# Patient Record
Sex: Female | Born: 1980 | Race: Black or African American | Hispanic: No | Smoking: Never smoker
Health system: Southern US, Community
[De-identification: ages and names within clinical notes are randomized; demographics above are authoritative.]

## PROBLEM LIST (undated history)

## (undated) ENCOUNTER — Inpatient Hospital Stay (HOSPITAL_COMMUNITY): Payer: Self-pay

## (undated) DIAGNOSIS — T7840XA Allergy, unspecified, initial encounter: Secondary | ICD-10-CM

## (undated) DIAGNOSIS — D649 Anemia, unspecified: Secondary | ICD-10-CM

## (undated) DIAGNOSIS — Z789 Other specified health status: Secondary | ICD-10-CM

## (undated) DIAGNOSIS — N390 Urinary tract infection, site not specified: Secondary | ICD-10-CM

## (undated) HISTORY — DX: Allergy, unspecified, initial encounter: T78.40XA

## (undated) HISTORY — DX: Urinary tract infection, site not specified: N39.0

## (undated) HISTORY — DX: Anemia, unspecified: D64.9

---

## 2011-09-19 NOTE — L&D Delivery Note (Signed)
Patient was C/C/+2.  I was bedside and evaluated the strip at about 0530 and she began pushing shortly after. She began having severe variables with each contraction and then had bradycardia to the 70s starting at about 0614.  At 0620 I was called and I was at bedside immediately.  I consented pt for VE and placed it and pulled baby out In one contraction.   Baby was slightly floppy but moving.  NICU in attendance and they were able to stimulate baby Without intervention of O2.  VE Female infant, Apgars 3,8, weight P.   Cord ph was 7.08. The patient had one second degree hymenal lacerations repaired with 2-0 vicryl R. Fundus was firm. EBL was expected. Placenta was delivered intact. Vagina was clear.  Baby was then vigorous to bedside.  Jhovany Weidinger A

## 2011-11-09 ENCOUNTER — Encounter (HOSPITAL_COMMUNITY): Payer: Self-pay | Admitting: Emergency Medicine

## 2011-11-09 ENCOUNTER — Emergency Department (HOSPITAL_COMMUNITY)
Admission: EM | Admit: 2011-11-09 | Discharge: 2011-11-09 | Disposition: A | Discharge status: Home Health | Payer: Medicaid Other | Attending: Emergency Medicine | Admitting: Emergency Medicine

## 2011-11-09 DIAGNOSIS — R109 Unspecified abdominal pain: Secondary | ICD-10-CM | POA: Insufficient documentation

## 2011-11-09 DIAGNOSIS — H0019 Chalazion unspecified eye, unspecified eyelid: Secondary | ICD-10-CM

## 2011-11-09 DIAGNOSIS — N939 Abnormal uterine and vaginal bleeding, unspecified: Secondary | ICD-10-CM

## 2011-11-09 DIAGNOSIS — N898 Other specified noninflammatory disorders of vagina: Secondary | ICD-10-CM | POA: Insufficient documentation

## 2011-11-09 LAB — PREGNANCY, URINE: Preg Test, Ur: POSITIVE — AB

## 2011-11-09 LAB — URINALYSIS, ROUTINE W REFLEX MICROSCOPIC
Bilirubin Urine: NEGATIVE
Glucose, UA: NEGATIVE mg/dL
Specific Gravity, Urine: 1.029 (ref 1.005–1.030)

## 2011-11-09 LAB — POCT PREGNANCY, URINE: Preg Test, Ur: NEGATIVE

## 2011-11-09 LAB — URINE MICROSCOPIC-ADD ON

## 2011-11-09 NOTE — ED Notes (Signed)
Patient is resting comfortably. 

## 2011-11-09 NOTE — ED Notes (Signed)
Vital signs stable. 

## 2011-11-09 NOTE — Discharge Instructions (Signed)
Chalazion A chalazion is a swelling or hard lump on the eyelid caused by a blocked oil gland. Chalazions may occur on the upper or the lower eyelid.  CAUSES  Oil gland in the eyelid becomes blocked. SYMPTOMS   Swelling or hard lump on the eyelid. This lump may make it hard to see out of the eye.   The swelling may spread to areas around the eye.  TREATMENT   Although some chalazions disappear by themselves in 1 or 2 months, some chalazions may need to be removed.   Medicines to treat an infection may be required.  HOME CARE INSTRUCTIONS   Wash your hands often and dry them with a clean towel. Do not touch the chalazion.   Apply heat to the eyelid several times a day for 10 minutes to help ease discomfort and bring any yellowish white fluid (pus) to the surface. One way to apply heat to a chalazion is to use the handle of a metal spoon.   Hold the handle under hot water until it is hot, and then wrap the handle in paper towels so that the heat can come through without burning your skin.   Hold the wrapped handle against the chalazion and reheat the spoon handle as needed.   Apply heat in this fashion for 10 minutes, 4 times per day.   Return to your caregiver to have the pus removed if it does not break (rupture) on its own.   Do not try to remove the pus yourself by squeezing the chalazion or sticking it with a pin or needle.   Only take over-the-counter or prescription medicines for pain, discomfort, or fever as directed by your caregiver.  SEEK IMMEDIATE MEDICAL CARE IF:   You have pain in your eye.   Your vision changes.   The chalazion does not go away.   The chalazion becomes painful, red, or swollen, grows larger, or does not start to disappear after 2 weeks.  MAKE SURE YOU:   Understand these instructions.   Will watch your condition.   Will get help right away if you are not doing well or get worse.  Document Released: 09/01/2000 Document Revised: 05/17/2011  Document Reviewed: 12/20/2009 [facility] Patient Information 2012 [place], [place].Dysfunctional Uterine Bleeding Normally, menstrual periods begin between ages 79 to 79 in young women. A normal menstrual cycle/period may begin every 23 days up to 35 days and lasts from 1 to 7 days. Around 12 to 14 days before your menstrual period starts, ovulation (ovary produces an egg) occurs. When counting the time between menstrual periods, count from the first day of bleeding of the previous period to the first day of bleeding of the next period. Dysfunctional (abnormal) uterine bleeding is bleeding that is different from a normal menstrual period. Your periods may come earlier or later than usual. They may be lighter, have blood clots or be heavier. You may have bleeding between periods, or you may skip one period or more. You may have bleeding after sexual intercourse, bleeding after menopause, or no menstrual period. CAUSES   Pregnancy (normal, miscarriage, tubal).   IUDs (intrauterine device, birth control).   Birth control pills.   Hormone treatment.   Menopause.   Infection of the cervix.   Blood clotting problems.   Infection of the inside lining of the uterus.   Endometriosis, inside lining of the uterus growing in the pelvis and other female organs.   Adhesions (scar tissue) inside the uterus.   Obesity or  severe weight loss.   Uterine polyps inside the uterus.   Cancer of the vagina, cervix, or uterus.   Ovarian cysts or polycystic ovary syndrome.   Medical problems (diabetes, thyroid disease).   Uterine fibroids (noncancerous tumor).   Problems with your female hormones.   Endometrial hyperplasia, very thick lining and enlarged cells inside of the uterus.   Medicines that interfere with ovulation.   Radiation to the pelvis or abdomen.   Chemotherapy.  DIAGNOSIS   Your doctor will discuss the history of your menstrual periods, medicines you are taking, changes in your  weight, stress in your life, and any medical problems you may have.   Your doctor will do a physical and pelvic examination.   Your doctor may want to perform certain tests to make a diagnosis, such as:   Pap test.   Blood tests.   Cultures for infection.   CT scan.   Ultrasound.   Hysteroscopy.   Laparoscopy.   MRI.   Hysterosalpingography.   D and C.   Endometrial biopsy.  TREATMENT  Treatment will depend on the cause of the dysfunctional uterine bleeding (DUB). Treatment may include:  Observing your menstrual periods for a couple of months.   Prescribing medicines for medical problems, including:   Antibiotics.   Hormones.   Birth control pills.   Removing an IUD (intrauterine device, birth control).   Surgery:   D and C (scrape and remove tissue from inside the uterus).   Laparoscopy (examine inside the abdomen with a lighted tube).   Uterine ablation (destroy lining of the uterus with electrical current, laser, heat, or freezing).   Hysteroscopy (examine cervix and uterus with a lighted tube).   Hysterectomy (remove the uterus).  HOME CARE INSTRUCTIONS   If medicines were prescribed, take exactly as directed. Do not change or switch medicines without consulting your caregiver.   Long term heavy bleeding may result in iron deficiency. Your caregiver may have prescribed iron pills. They help replace the iron that your body lost from heavy bleeding. Take exactly as directed.   Do not take aspirin or medicines that contain aspirin one week before or during your menstrual period. Aspirin may make the bleeding worse.   If you need to change your sanitary pad or tampon more than once every 2 hours, stay in bed with your feet elevated and a cold pack on your lower abdomen. Rest as much as possible, until the bleeding stops or slows down.   Eat well-balanced meals. Eat foods high in iron. Examples are:   Leafy green vegetables.   Whole-grain breads and  cereals.   Eggs.   Meat.   Liver.   Do not try to lose weight until the abnormal bleeding has stopped and your blood iron level is back to normal. Do not lift more than ten pounds or do strenuous activities when you are bleeding.   For a couple of months, make note on your calendar, marking the start and ending of your period, and the type of bleeding (light, medium, heavy, spotting, clots or missed periods). This is for your caregiver to better evaluate your problem.  SEEK MEDICAL CARE IF:   You develop nausea (feeling sick to your stomach) and vomiting, dizziness, or diarrhea while you are taking your medicine.   You are getting lightheaded or weak.   You have any problems that may be related to the medicine you are taking.   You develop pain with your DUB.   You  want to remove your IUD.   You want to stop or change your birth control pills or hormones.   You have any type of abnormal bleeding mentioned above.   You are over 42 years old and have not had a menstrual period yet.   You are 31 years old and you are still having menstrual periods.   You have any of the symptoms mentioned above.   You develop a rash.  SEEK IMMEDIATE MEDICAL CARE IF:   An oral temperature above 102 F (38.9 C) develops.   You develop chills.   You are changing your sanitary pad or tampon more than once an hour.   You develop abdominal pain.   You pass out or faint.  Document Released: 09/01/2000 Document Revised: 05/17/2011 Document Reviewed: 08/03/2009 [facility] Patient Information 2012 [place], [place].

## 2011-11-09 NOTE — ED Notes (Signed)
MD at bedside. 

## 2011-11-09 NOTE — ED Provider Notes (Signed)
History     CSN: [id number]  Arrival date & time 11/09/11  4098   First MD Initiated Contact with Patient 11/09/11 0945      Chief Complaint  Patient presents with  . Vaginal Bleeding  . Abdominal Cramping    (Consider location/radiation/quality/duration/timing/severity/associated sxs/prior treatment) The history is provided by the patient.  G2P1A1 patient reports several days of vaginal bleeding which was normal for typical menstrual cycle.  She reports worsening abdominal cramping however.  She reports her vaginal bleeding abdominal cramping of subsided but she possibly had a positive pregnancy test at home 0 Vicryl evaluation here in the ER.  She denies abdominal cramping at this time.  She's had no vaginal bleeding today.  She denies fevers and chills.  She denies nausea and vomiting.  Nothing worsens her symptoms.  Nothing improves her symptoms.  Her symptoms are none.  Patient also complains of a bump to her right upper inner eyelid will approximately 2 weeks.  She's had no change in her vision.  She denies lumps or changes at the edge of her eyelids.  History reviewed. No pertinent past medical history.  History reviewed. No pertinent past surgical history.  History reviewed. No pertinent family history.  History  Substance Use Topics  . Smoking status: Not on file  . Smokeless tobacco: Not on file  . Alcohol Use: Not on file    OB History    Grav Para Term Preterm Abortions TAB SAB Ect Mult Living                  Review of Systems  Genitourinary: Positive for vaginal bleeding.  All other systems reviewed and are negative.    Allergies  Review of patient's allergies indicates no known allergies.  Home Medications   Current Outpatient Rx  Name Route Sig Dispense Refill  . MULTIVITAMIN/IRON PO Oral Take 1 tablet by mouth daily.    . TRIAMCINOLONE ACETONIDE 0.5 % EX OINT Topical Apply 1 application topically daily as needed. Eczema outbreak      BP 120/74   Pulse 85  Temp(Src) 97.7 F (36.5 C) (Oral)  Resp 91  SpO2 98%  Physical Exam  Nursing note and vitals reviewed. Constitutional: She is oriented to person, place, and time. She appears well-developed and well-nourished. No distress.  HENT:  Head: Normocephalic and atraumatic.       Patient with small chalazion of right upper eyelid.  No evidence of hordeolum.  Eyes: EOM are normal.  Neck: Normal range of motion.  Cardiovascular: Normal rate, regular rhythm and normal heart sounds.   Pulmonary/Chest: Effort normal and breath sounds normal.  Abdominal: Soft. She exhibits no distension. There is no tenderness.  Musculoskeletal: Normal range of motion.  Neurological: She is alert and oriented to person, place, and time.  Skin: Skin is warm and dry.  Psychiatric: She has a normal mood and affect. Judgment normal.    ED Course  Procedures (including critical care time)   Labs Reviewed  POCT PREGNANCY, URINE  URINALYSIS, ROUTINE W REFLEX MICROSCOPIC  PREGNANCY, URINE   No results found.   1. Vaginal bleeding   2. Chalazion       MDM  The patient's serum pregnancy test is negative.  I suspect this was a normal menstrual cycle.  She no longer has pain or vaginal bleeding we'll discharge home.  The patient has evidence of a chalazion.  She will be  placed on warm compresses.  Lyanne Co, MD 11/09/11 1018

## 2011-11-18 ENCOUNTER — Encounter (HOSPITAL_COMMUNITY): Payer: Self-pay | Admitting: *Deleted

## 2011-11-18 ENCOUNTER — Inpatient Hospital Stay (HOSPITAL_COMMUNITY): Payer: Medicaid Other

## 2011-11-18 ENCOUNTER — Inpatient Hospital Stay (HOSPITAL_COMMUNITY)
Admission: AD | Admit: 2011-11-18 | Discharge: 2011-11-19 | Disposition: A | Discharge status: Home / Self Care | Payer: Medicaid Other | Source: Ambulatory Visit | Attending: Obstetrics and Gynecology | Admitting: Obstetrics and Gynecology

## 2011-11-18 DIAGNOSIS — O4690 Antepartum hemorrhage, unspecified, unspecified trimester: Secondary | ICD-10-CM

## 2011-11-18 DIAGNOSIS — O34599 Maternal care for other abnormalities of gravid uterus, unspecified trimester: Secondary | ICD-10-CM | POA: Insufficient documentation

## 2011-11-18 DIAGNOSIS — O209 Hemorrhage in early pregnancy, unspecified: Secondary | ICD-10-CM | POA: Insufficient documentation

## 2011-11-18 DIAGNOSIS — R1032 Left lower quadrant pain: Secondary | ICD-10-CM | POA: Insufficient documentation

## 2011-11-18 DIAGNOSIS — D649 Anemia, unspecified: Secondary | ICD-10-CM | POA: Insufficient documentation

## 2011-11-18 DIAGNOSIS — N83209 Unspecified ovarian cyst, unspecified side: Secondary | ICD-10-CM | POA: Insufficient documentation

## 2011-11-18 DIAGNOSIS — O99019 Anemia complicating pregnancy, unspecified trimester: Secondary | ICD-10-CM | POA: Insufficient documentation

## 2011-11-18 HISTORY — DX: Other specified health status: Z78.9

## 2011-11-18 LAB — CBC
Hemoglobin: 9.8 g/dL — ABNORMAL LOW (ref 12.0–15.0)
MCH: 21.9 pg — ABNORMAL LOW (ref 26.0–34.0)
MCHC: 31.8 g/dL (ref 30.0–36.0)
Platelets: 289 10*3/uL (ref 150–400)
RDW: 14.6 % (ref 11.5–15.5)

## 2011-11-18 LAB — WET PREP, GENITAL
Clue Cells Wet Prep HPF POC: NONE SEEN
Trich, Wet Prep: NONE SEEN
Yeast Wet Prep HPF POC: NONE SEEN

## 2011-11-18 LAB — URINALYSIS, ROUTINE W REFLEX MICROSCOPIC
Bilirubin Urine: NEGATIVE
Ketones, ur: 15 mg/dL — AB
Nitrite: NEGATIVE
Protein, ur: NEGATIVE mg/dL
Urobilinogen, UA: 0.2 mg/dL (ref 0.0–1.0)

## 2011-11-18 LAB — HCG, QUANTITATIVE, PREGNANCY: hCG, Beta Chain, Quant, S: 4623 m[IU]/mL — ABNORMAL HIGH (ref <5)

## 2011-11-18 MED ORDER — INTEGRA F 125-1 MG PO CAPS: 1.0000 | ORAL_CAPSULE | Freq: Every day | ORAL | Status: DC

## 2011-11-18 NOTE — Progress Notes (Signed)
Threasa Heads CNM in. Spec exam and bimanual.

## 2011-11-18 NOTE — Progress Notes (Signed)
Threasa Heads CNM in to see pt and discuss u/s results and d./c plan. To return in 7 days for repeat u/s. Understanding voiced

## 2011-11-18 NOTE — Discharge Instructions (Signed)
Vaginal Bleeding During Pregnancy °A small amount of bleeding from the vagina can happen anytime during pregnancy. Be sure to tell your doctor about all vaginal bleeding.  °HOME CARE °· Get plenty of rest and sleep.  °· Count the number of pads you use each day. Do not use tampons.  °· Save any tissue you pass for your doctor to see.  °· Do not exercise  °· Do not do any heavy lifting.  °· Avoid going up and down stairs. If you must climb stairs, go slowly.  °· Do not have sex (intercourse) or orgasms until approved by your doctor.  °· Do not douche.  °· Only take medicine as told by your doctor. Do not take aspirin.  °· Eat healthy.  °· Always keep your follow-up appointments.  °GET HELP RIGHT AWAY IF:  °· You feel the baby moving less or not moving at all.  °· The bleeding gets worse.  °· You have very painful cramps or pain in your stomach or back.  °· You pass large clots or anything that looks like tissue.  °· You have a temperature by mouth above 102° F (38.9° C).  °· You feel very weak.  °· You have chills.  °· You feel dizzy or pass out (faint).  °· You have a gush of fluid from the vagina.  °MAKE SURE YOU:  °· Understand these instructions.  °· Will watch your condition.  °· Will get help right away if you are not doing well or get worse.  °Document Released: 06/13/2008 Document Revised: 05/17/2011 Document Reviewed: 08/10/2009 °[employer]® Patient Information ©2012 [employer]. °

## 2011-11-18 NOTE — Progress Notes (Signed)
Now noted currently.

## 2011-11-18 NOTE — Progress Notes (Signed)
G3P1 TAB 1. Pain in LLQ couple hrs ago. Lambert Mody and comes and goes. Vag. bleeding about an hour ago. R shoulder pain after vag. bleeding. Shoulder pain gone now.

## 2011-11-18 NOTE — ED Provider Notes (Signed)
History     Chief Complaint  Patient presents with  . Vaginal Bleeding  . Abdominal Pain   HPI  Pain in LLQ that started a couple hrs ago. Pain is sharp and intermittent. Vag. bleeding started about an hour ago. Also reports right shoulder pain after vag. bleeding. Now longer has shoulder pain.  BHCG 3000 (11/17/11) at [facility].  Ultrasound two weeks ago - nothing seen in uterus.  No UTI symptoms.   Past Medical History  Diagnosis Date  . No pertinent past medical history     Past Surgical History  Procedure Date  . Cesarean section     Family History  Problem Relation Age of Onset  . Anesthesia problems Neg Hx   . Hypotension Neg Hx   . Malignant hyperthermia Neg Hx   . Pseudochol deficiency Neg Hx     History  Substance Use Topics  . Smoking status: Never Smoker   . Smokeless tobacco: Not on file  . Alcohol Use: No    Allergies: No Known Allergies  Prescriptions prior to admission  Medication Sig Dispense Refill  . triamcinolone ointment (KENALOG) 0.5 % Apply 1 application topically daily as needed. Eczema outbreak        Review of Systems  Gastrointestinal: Positive for abdominal pain.  Genitourinary:       Vaginal bleeding  All other systems reviewed and are negative.   Physical Exam   Blood pressure 115/63, pulse 86, temperature 97.6 F (36.4 C), temperature source Oral, resp. rate 20, height 5\' 3"  (1.6 m), weight 66.225 kg (146 lb), SpO2 100.00%.  Physical Exam  Constitutional: She is oriented to person, place, and time. She appears well-developed and well-nourished. No distress.  HENT:  Head: Normocephalic.  Eyes: Pupils are equal, round, and reactive to light.  Neck: Normal range of motion. Neck supple.  Cardiovascular: Normal rate, regular rhythm and normal heart sounds.   Respiratory: Effort normal and breath sounds normal.  GI: Soft. She exhibits no mass. There is no tenderness. There is no guarding.  Genitourinary: No bleeding around the  vagina. Vaginal discharge (white, creamy) found.       Negative cervical motion tenderness  Neurological: She is alert and oriented to person, place, and time. She has normal reflexes.  Skin: Skin is warm and dry.    MAU Course  Procedures Results for orders placed during the hospital encounter of 11/18/11 (from the past 24 hour(s))  URINALYSIS, ROUTINE W REFLEX MICROSCOPIC     Status: Abnormal   Collection Time   11/18/11  9:10 PM      Component Value Range   Color, Urine YELLOW  YELLOW    APPearance CLEAR  CLEAR    Specific Gravity, Urine 1.020  1.005 - 1.030    pH 7.5  5.0 - 8.0    Glucose, UA NEGATIVE  NEGATIVE (mg/dL)   Hgb urine dipstick MODERATE (*) NEGATIVE    Bilirubin Urine NEGATIVE  NEGATIVE    Ketones, ur 15 (*) NEGATIVE (mg/dL)   Protein, ur NEGATIVE  NEGATIVE (mg/dL)   Urobilinogen, UA 0.2  0.0 - 1.0 (mg/dL)   Nitrite NEGATIVE  NEGATIVE    Leukocytes, UA NEGATIVE  NEGATIVE   URINE MICROSCOPIC-ADD ON     Status: Abnormal   Collection Time   11/18/11  9:10 PM      Component Value Range   Squamous Epithelial / LPF MANY (*) RARE    WBC, UA 0-2  <3 (WBC/hpf)   RBC / HPF  3-6  <3 (RBC/hpf)  POCT PREGNANCY, URINE     Status: Abnormal   Collection Time   11/18/11  9:17 PM      Component Value Range   Preg Test, Ur POSITIVE (*) NEGATIVE   WET PREP, GENITAL     Status: Abnormal   Collection Time   11/18/11 10:05 PM      Component Value Range   Yeast Wet Prep HPF POC NONE SEEN  NONE SEEN    Trich, Wet Prep NONE SEEN  NONE SEEN    Clue Cells Wet Prep HPF POC NONE SEEN  NONE SEEN    WBC, Wet Prep HPF POC FEW (*) NONE SEEN   CBC     Status: Abnormal   Collection Time   11/18/11 10:10 PM      Component Value Range   WBC 5.8  4.0 - 10.5 (K/uL)   RBC 4.47  3.87 - 5.11 (MIL/uL)   Hemoglobin 9.8 (*) 12.0 - 15.0 (g/dL)   HCT 16.1 (*) 09.6 - 46.0 (%)   MCV 68.9 (*) 78.0 - 100.0 (fL)   MCH 21.9 (*) 26.0 - 34.0 (pg)   MCHC 31.8  30.0 - 36.0 (g/dL)   RDW 04.5  40.9 - 81.1 (%)    Platelets 289  150 - 400 (K/uL)  HCG, QUANTITATIVE, PREGNANCY     Status: Abnormal   Collection Time   11/18/11 10:10 PM      Component Value Range   hCG, Beta Chain, Quant, S 4623 (*) <5 (mIU/mL)  ABO/RH     Status: Normal   Collection Time   11/18/11 10:10 PM      Component Value Range   ABO/RH(D) O POS     Ultrasound: IMPRESSION:  1. Single intrauterine gestational sac without visible embryo.  These findings may be consistent with an early intrauterine  pregnancy. Recommend follow-up imaging in 7 to 10 days to ensure  normal progression of pregnancy and to exclude an anembryonic  (failed) pregnancy.  2. Simple appearing left ovarian cyst.   Assessment and Plan  Anemia Intrauterine Pregnancy  Plan:  DC to home Bleeding precautions RX Integra F/u ultrasound in 7-10 day.  [facility] 11/18/2011, 9:45 PM

## 2011-11-19 NOTE — ED Notes (Signed)
U/S will call pt tomorrow with date and time of appt. Pt's number given to u/s by pt.

## 2011-11-20 ENCOUNTER — Other Ambulatory Visit: Payer: Self-pay | Admitting: Physician Assistant

## 2011-11-20 DIAGNOSIS — O3680X0 Pregnancy with inconclusive fetal viability, not applicable or unspecified: Secondary | ICD-10-CM

## 2011-11-20 LAB — GC/CHLAMYDIA PROBE AMP, GENITAL: GC Probe Amp, Genital: NEGATIVE

## 2011-11-27 ENCOUNTER — Ambulatory Visit (HOSPITAL_COMMUNITY)
Admission: RE | Admit: 2011-11-27 | Discharge: 2011-11-27 | Disposition: A | Discharge status: Home / Self Care | Payer: Medicaid Other | Source: Ambulatory Visit | Attending: Physician Assistant | Admitting: Physician Assistant

## 2011-11-27 ENCOUNTER — Ambulatory Visit (HOSPITAL_COMMUNITY): Payer: Medicaid Other

## 2011-11-27 DIAGNOSIS — O34219 Maternal care for unspecified type scar from previous cesarean delivery: Secondary | ICD-10-CM | POA: Insufficient documentation

## 2011-11-27 DIAGNOSIS — O3680X0 Pregnancy with inconclusive fetal viability, not applicable or unspecified: Secondary | ICD-10-CM | POA: Insufficient documentation

## 2011-11-30 NOTE — ED Provider Notes (Signed)
Agree with above note.  Lisa Singh 11/30/2011 10:40 AM

## 2012-01-01 LAB — OB RESULTS CONSOLE HIV ANTIBODY (ROUTINE TESTING): HIV: NONREACTIVE

## 2012-01-01 LAB — OB RESULTS CONSOLE ANTIBODY SCREEN: Antibody Screen: NEGATIVE

## 2012-01-01 LAB — OB RESULTS CONSOLE HEPATITIS B SURFACE ANTIGEN: Hepatitis B Surface Ag: NEGATIVE

## 2012-01-01 LAB — OB RESULTS CONSOLE RUBELLA ANTIBODY, IGM: Rubella: IMMUNE

## 2012-01-01 LAB — OB RESULTS CONSOLE RPR: RPR: NONREACTIVE

## 2012-05-12 ENCOUNTER — Inpatient Hospital Stay (HOSPITAL_COMMUNITY)
Admission: AD | Admit: 2012-05-12 | Discharge: 2012-05-12 | Disposition: A | Discharge status: Home / Self Care | Payer: BC Managed Care – PPO | Source: Ambulatory Visit | Attending: Obstetrics and Gynecology | Admitting: Obstetrics and Gynecology

## 2012-05-12 ENCOUNTER — Encounter (HOSPITAL_COMMUNITY): Payer: Self-pay | Admitting: *Deleted

## 2012-05-12 DIAGNOSIS — R079 Chest pain, unspecified: Secondary | ICD-10-CM | POA: Insufficient documentation

## 2012-05-12 DIAGNOSIS — O99891 Other specified diseases and conditions complicating pregnancy: Secondary | ICD-10-CM | POA: Insufficient documentation

## 2012-05-12 MED ORDER — GI COCKTAIL ~~LOC~~
30.0000 mL | Freq: Three times a day (TID) | ORAL | Status: DC | PRN
  Administered 2012-05-12: 30 mL via ORAL
  Filled 2012-05-12: qty 30

## 2012-05-12 NOTE — MAU Provider Note (Signed)
Lisa Singh is a 31 y.o. female presenting for eval of onset dull chest pain this evening. This pain has mostly resolved by the time of arrival to MAU. She also reports a feeling of 'poor circulation' in her right arm/fingers. Denies leak/bldg but reports irreg ctx earlier at home. History OB History    Grav Para Term Preterm Abortions TAB SAB Ect Mult Living   3 1 1  0 1 1 0 0 0 1     Past Medical History  Diagnosis Date  . No pertinent past medical history    Past Surgical History  Procedure Date  . Cesarean section    Family History: family history is negative for Anesthesia problems, and Hypotension, and Malignant hyperthermia, and Pseudochol deficiency, and Other, . Social History:  reports that she has never smoked. She does not have any smokeless tobacco history on file. She reports that she does not drink alcohol or use illicit drugs.    ROS    Blood pressure 123/69, pulse 88, temperature 97.6 F (36.4 C), temperature source Oral, resp. rate 20, height 5' 1.25" (1.556 m), weight 77.656 kg (171 lb 3.2 oz), last menstrual period 10/13/2011. Maternal Exam:  Uterine Assessment: One 40sec ctx in 40 mins of EFM so far  Cervix: Cx C/L/post  Fetal Exam Fetal Monitor Review: Baseline rate: 135.  Variability: moderate (6-25 bpm).   Pattern: accelerations present and no decelerations.    Fetal State Assessment: Category I - tracings are normal.     Physical Exam  Constitutional: She is oriented to person, place, and time. She appears well-developed and well-nourished.  HENT:  Head: Normocephalic.  Neck: Normal range of motion.  Cardiovascular: Normal rate.   Respiratory: Effort normal.  Genitourinary: Vagina normal.  Musculoskeletal: Normal range of motion.       Right arm without edema and with full ROM and strength  Neurological: She is alert and oriented to person, place, and time.  Skin: Skin is warm and dry.  Psychiatric: She has a normal mood and affect.  Her behavior is normal. Thought content normal.    Prenatal labs: ABO, Rh: --/--/O POS (03/02 2210) Antibody:   Rubella:   RPR:    HBsAg:    HIV:    GBS:     Assessment/Plan: IUP at 30.2 wks Chest pain- resolved  Given GI cocktail upon arrival which relieved symptoms; denies chest pain/right arm pain currently Rev'd pt with Dr Claiborne Billings with orders to d/c home Keep next scheduled visit   Brenn Deziel 05/12/2012, 1:24 AM

## 2012-05-12 NOTE — MAU Note (Signed)
My chest started hurting few hrs ago between my breast. Is dull pain that comes and goes.

## 2012-05-12 NOTE — Progress Notes (Signed)
Philipp Deputy CNM in earlier and d/c plan discussed. Written and verbal d/c instructions given and understanding voiced.

## 2012-06-23 ENCOUNTER — Inpatient Hospital Stay (HOSPITAL_COMMUNITY)
Admission: AD | Admit: 2012-06-23 | Discharge: 2012-06-26 | DRG: 372 | Disposition: A | Discharge status: Home / Self Care | Payer: BC Managed Care – PPO | Source: Ambulatory Visit | Attending: Obstetrics and Gynecology | Admitting: Obstetrics and Gynecology

## 2012-06-23 ENCOUNTER — Encounter (HOSPITAL_COMMUNITY): Payer: Self-pay | Admitting: *Deleted

## 2012-06-23 ENCOUNTER — Encounter (HOSPITAL_COMMUNITY): Payer: Self-pay | Admitting: Anesthesiology

## 2012-06-23 ENCOUNTER — Inpatient Hospital Stay (HOSPITAL_COMMUNITY): Payer: BC Managed Care – PPO | Admitting: Anesthesiology

## 2012-06-23 DIAGNOSIS — IMO0002 Reserved for concepts with insufficient information to code with codable children: Secondary | ICD-10-CM

## 2012-06-23 DIAGNOSIS — O1414 Severe pre-eclampsia complicating childbirth: Principal | ICD-10-CM | POA: Diagnosis present

## 2012-06-23 DIAGNOSIS — O1490 Unspecified pre-eclampsia, unspecified trimester: Secondary | ICD-10-CM

## 2012-06-23 DIAGNOSIS — O34219 Maternal care for unspecified type scar from previous cesarean delivery: Secondary | ICD-10-CM | POA: Diagnosis present

## 2012-06-23 LAB — CBC
HCT: 32.5 % — ABNORMAL LOW (ref 36.0–46.0)
HCT: 32.6 % — ABNORMAL LOW (ref 36.0–46.0)
Hemoglobin: 10.5 g/dL — ABNORMAL LOW (ref 12.0–15.0)
Hemoglobin: 10.5 g/dL — ABNORMAL LOW (ref 12.0–15.0)
MCV: 70.7 fL — ABNORMAL LOW (ref 78.0–100.0)
MCV: 70.1 fL — ABNORMAL LOW (ref 78.0–100.0)
Platelets: 173 10*3/uL (ref 150–400)
RBC: 4.6 MIL/uL (ref 3.87–5.11)
RBC: 4.65 MIL/uL (ref 3.87–5.11)
WBC: 8.1 10*3/uL (ref 4.0–10.5)
WBC: 7.9 10*3/uL (ref 4.0–10.5)

## 2012-06-23 LAB — URINALYSIS, ROUTINE W REFLEX MICROSCOPIC
Bilirubin Urine: NEGATIVE
Ketones, ur: NEGATIVE mg/dL
Nitrite: NEGATIVE
pH: 6 (ref 5.0–8.0)

## 2012-06-23 LAB — COMPREHENSIVE METABOLIC PANEL
BUN: 14 mg/dL (ref 6–23)
CO2: 20 mEq/L (ref 19–32)
Chloride: 104 mEq/L (ref 96–112)
Creatinine, Ser: 0.94 mg/dL (ref 0.50–1.10)
GFR calc non Af Amer: 81 mL/min — ABNORMAL LOW (ref >90)
Total Bilirubin: 0.1 mg/dL — ABNORMAL LOW (ref 0.3–1.2)

## 2012-06-23 LAB — URINE MICROSCOPIC-ADD ON

## 2012-06-23 LAB — LACTATE DEHYDROGENASE: LDH: 159 U/L (ref 94–250)

## 2012-06-23 LAB — URIC ACID: Uric Acid, Serum: 7.8 mg/dL — ABNORMAL HIGH (ref 2.4–7.0)

## 2012-06-23 MED ORDER — PHENYLEPHRINE 40 MCG/ML (10ML) SYRINGE FOR IV PUSH (FOR BLOOD PRESSURE SUPPORT): 80.0000 ug | PREFILLED_SYRINGE | INTRAVENOUS | Status: DC | PRN

## 2012-06-23 MED ORDER — LACTATED RINGERS IV SOLN
500.0000 mL | INTRAVENOUS | Status: DC | PRN
  Administered 2012-06-23: 500 mL via INTRAVENOUS

## 2012-06-23 MED ORDER — LABETALOL HCL 5 MG/ML IV SOLN
5.0000 mg | Freq: Once | INTRAVENOUS | Status: AC
  Administered 2012-06-23: 5 mg via INTRAVENOUS

## 2012-06-23 MED ORDER — LIDOCAINE HCL (PF) 1 % IJ SOLN
INTRAMUSCULAR | Status: DC | PRN
  Administered 2012-06-23 (×4): 4 mL

## 2012-06-23 MED ORDER — EPHEDRINE 5 MG/ML INJ: 10.0000 mg | INTRAVENOUS | Status: DC | PRN

## 2012-06-23 MED ORDER — FENTANYL 2.5 MCG/ML BUPIVACAINE 1/10 % EPIDURAL INFUSION (WH - ANES)
14.0000 mL/h | INTRAMUSCULAR | Status: DC
  Administered 2012-06-23 – 2012-06-24 (×2): 14 mL/h via EPIDURAL
  Filled 2012-06-23 (×2): qty 123

## 2012-06-23 MED ORDER — OXYTOCIN BOLUS FROM INFUSION
500.0000 mL | Freq: Once | INTRAVENOUS | Status: AC
  Administered 2012-06-24: 500 mL via INTRAVENOUS
  Filled 2012-06-23: qty 500

## 2012-06-23 MED ORDER — LABETALOL HCL 5 MG/ML IV SOLN
20.0000 mg | Freq: Once | INTRAVENOUS | Status: AC
  Administered 2012-06-23: 20 mg via INTRAVENOUS

## 2012-06-23 MED ORDER — PHENYLEPHRINE 40 MCG/ML (10ML) SYRINGE FOR IV PUSH (FOR BLOOD PRESSURE SUPPORT)
80.0000 ug | PREFILLED_SYRINGE | INTRAVENOUS | Status: DC | PRN
  Filled 2012-06-23: qty 5

## 2012-06-23 MED ORDER — MAGNESIUM SULFATE 40 G IN LACTATED RINGERS - SIMPLE
2.0000 g/h | INTRAVENOUS | Status: DC
  Administered 2012-06-23 – 2012-06-24 (×2): 2 g/h via INTRAVENOUS
  Filled 2012-06-23 (×2): qty 500

## 2012-06-23 MED ORDER — LIDOCAINE HCL (PF) 1 % IJ SOLN
30.0000 mL | INTRAMUSCULAR | Status: DC | PRN
  Filled 2012-06-23: qty 30

## 2012-06-23 MED ORDER — LABETALOL HCL 5 MG/ML IV SOLN
INTRAVENOUS | Status: AC
  Filled 2012-06-23: qty 4

## 2012-06-23 MED ORDER — OXYCODONE-ACETAMINOPHEN 5-325 MG PO TABS: 1.0000 | ORAL_TABLET | ORAL | Status: DC | PRN

## 2012-06-23 MED ORDER — LACTATED RINGERS IV SOLN: 500.0000 mL | Freq: Once | INTRAVENOUS | Status: DC

## 2012-06-23 MED ORDER — TERBUTALINE SULFATE 1 MG/ML IJ SOLN: 0.2500 mg | Freq: Once | INTRAMUSCULAR | Status: AC | PRN

## 2012-06-23 MED ORDER — EPHEDRINE 5 MG/ML INJ
10.0000 mg | INTRAVENOUS | Status: DC | PRN
  Filled 2012-06-23: qty 4

## 2012-06-23 MED ORDER — MAGNESIUM SULFATE BOLUS VIA INFUSION
4.0000 g | Freq: Once | INTRAVENOUS | Status: AC
  Administered 2012-06-23: 4 g via INTRAVENOUS
  Filled 2012-06-23: qty 500

## 2012-06-23 MED ORDER — FLEET ENEMA 7-19 GM/118ML RE ENEM: 1.0000 | ENEMA | RECTAL | Status: DC | PRN

## 2012-06-23 MED ORDER — IBUPROFEN 600 MG PO TABS: 600.0000 mg | ORAL_TABLET | Freq: Four times a day (QID) | ORAL | Status: DC | PRN

## 2012-06-23 MED ORDER — OXYTOCIN 40 UNITS IN LACTATED RINGERS INFUSION - SIMPLE MED: 1.0000 m[IU]/min | INTRAVENOUS | Status: DC

## 2012-06-23 MED ORDER — OXYTOCIN 40 UNITS IN LACTATED RINGERS INFUSION - SIMPLE MED
1.0000 m[IU]/min | INTRAVENOUS | Status: DC
  Administered 2012-06-23: 2 m[IU]/min via INTRAVENOUS
  Filled 2012-06-23: qty 1000

## 2012-06-23 MED ORDER — CITRIC ACID-SODIUM CITRATE 334-500 MG/5ML PO SOLN: 30.0000 mL | ORAL | Status: DC | PRN

## 2012-06-23 MED ORDER — DIPHENHYDRAMINE HCL 50 MG/ML IJ SOLN: 12.5000 mg | INTRAMUSCULAR | Status: DC | PRN

## 2012-06-23 MED ORDER — LACTATED RINGERS IV SOLN
INTRAVENOUS | Status: DC
  Administered 2012-06-23: 04:00:00 via INTRAVENOUS
  Administered 2012-06-23: 100 mL via INTRAVENOUS
  Administered 2012-06-24: 02:00:00 via INTRAVENOUS

## 2012-06-23 MED ORDER — ACETAMINOPHEN 325 MG PO TABS
650.0000 mg | ORAL_TABLET | ORAL | Status: DC | PRN
  Administered 2012-06-23 (×3): 650 mg via ORAL
  Filled 2012-06-23 (×3): qty 2

## 2012-06-23 MED ORDER — OXYTOCIN 40 UNITS IN LACTATED RINGERS INFUSION - SIMPLE MED
62.5000 mL/h | Freq: Once | INTRAVENOUS | Status: AC
  Administered 2012-06-24: 62.5 mL/h via INTRAVENOUS

## 2012-06-23 MED ORDER — ONDANSETRON HCL 4 MG/2ML IJ SOLN: 4.0000 mg | Freq: Four times a day (QID) | INTRAMUSCULAR | Status: DC | PRN

## 2012-06-23 NOTE — Plan of Care (Signed)
Problem: Consults Goal: Birthing Suites Patient Information Press F2 to bring up selections list Outcome: Completed/Met Date Met:  06/23/12  Pt < [redacted] weeks EGA, Inpatient induction and PIH (Pregnancy induced hypertension)  Comments:  36.2 wks

## 2012-06-23 NOTE — H&P (Signed)
31 y.o. [redacted]w[redacted]d  G3P1011 comes in c/o labor and found to have elevated BPs and 4+ proteinuria.  Otherwise has good fetal movement and no bleeding.  She does not c/o any other s/s severe preeclampsia.  Past Medical History  Diagnosis Date  . No pertinent past medical history     Past Surgical History  Procedure Date  . Cesarean section     OB History    Grav Para Term Preterm Abortions TAB SAB Ect Mult Living   3 1 1  0 1 1 0 0 0 1     # Outc Date GA Lbr Len/2nd Wgt Sex Del Anes PTL Lv   1 TRM 8/05    F LTCS      2 TAB            3 CUR               History   Social History  . Marital Status: Married    Spouse Name: N/A    Number of Children: N/A  . Years of Education: N/A   Occupational History  . Not on file.   Social History Main Topics  . Smoking status: Never Smoker   . Smokeless tobacco: Not on file  . Alcohol Use: No  . Drug Use: No  . Sexually Active: Yes   Other Topics Concern  . Not on file   Social History Narrative  . No narrative on file   Review of patient's allergies indicates no known allergies.   Prenatal Course:  Uncomplicated except for hx HSV.  Filed Vitals:   06/23/12 0516  BP: 152/95  Pulse: 76  Temp:   Resp:      BPS have been as high as 183/105 and responded to labetalol  Lungs/Cor:  NAD Abdomen:  soft, gravid, baby vtx by bedside u/s. Ex:  no cords, erythema SVE:  Closed/50/high SSE: no lesions inside or outside. FHTs:  120, good STV, NST R Toco:  q3-6   A/P   Severe preeclampsia at [redacted]w[redacted]d.  Start induction and Magnesium sulfate.  Labetalol IV for BP control.          Pt had C/S first pregnancy for AOAP but strongly desires TOL- will start pitocin.  GBS unknown for now- PCN.  Lisa Singh A

## 2012-06-23 NOTE — H&P (Signed)
Lisa Singh is a 31 y.o. female presenting for contractions. Maternal Medical History:  Reason for admission: Reason for admission: contractions.  Reason for Admission:   nauseaContractions: Onset was 3-5 hours ago.   Frequency: regular.   Perceived severity is moderate.    Fetal activity: Perceived fetal activity is normal.   Last perceived fetal movement was within the past hour.    Prenatal complications: Hypertension and pre-eclampsia.   Prenatal Complications - Diabetes: none.   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 No pertinent past medical history    Past Surgical History  Procedure Date  . Cesarean section    Family History: family history is negative for Anesthesia problems, and Hypotension, and Malignant hyperthermia, and Pseudochol deficiency, and Other, . Social History:  reports that she has never smoked. She does not have any smokeless tobacco history on file. She reports that she does not drink alcohol or use illicit drugs.   Prenatal Transfer Tool  Maternal Diabetes: No Genetic Screening: Normal Maternal Ultrasounds/Referrals: Normal Fetal Ultrasounds or other Referrals:  None Maternal Substance Abuse:  No Significant Maternal Medications:  None Significant Maternal Lab Results:  None  GBS not available, so will get rapid test  Other Comments:  None  Review of Systems  Constitutional: Negative for fever.  Eyes: Negative for blurred vision.  Cardiovascular: Positive for chest pain.  Gastrointestinal: Positive for abdominal pain. Negative for nausea, vomiting, diarrhea and constipation.  Neurological: Positive for headaches.    Dilation: Closed Effacement (%): 70 Station: -3 Exam by:: Artelia Laroche CNM Blood pressure 172/98, pulse 63, temperature 98 F (36.7 C), temperature source Oral, resp.  rate 20, height 5\' 2"  (1.575 m), weight 194 lb 3.2 oz (88.089 kg), last menstrual period 10/13/2011, SpO2 100.00%. Maternal Exam:  Uterine Assessment: Contraction strength is moderate.  Contraction frequency is regular.   Abdomen: Patient reports no abdominal tenderness. Estimated fetal weight is 6.   Fetal presentation: vertex  Introitus: Normal vulva. Vagina is positive for vaginal discharge (thick white).  Ferning test: not done.  Nitrazine test: not done. Amniotic fluid character: not assessed.  Pelvis: adequate for delivery.   Cervix: Cervix evaluated by digital exam.     Fetal Exam Fetal Monitor Review: Mode: ultrasound.   Baseline rate: 140.  Variability: moderate (6-25 bpm).   Pattern: accelerations present and no decelerations.    Fetal State Assessment: Category I - tracings are normal.     Physical Exam  Constitutional: She is oriented to person, place, and time. She appears well-developed and well-nourished. No distress (but uncomfortable with contractions).  Cardiovascular: Normal rate and regular rhythm.   Murmur (systolic) heard. Respiratory: Effort normal and breath sounds normal. No respiratory distress. She has no wheezes. She has no rales.  GI: Soft.  Genitourinary: Uterus normal. Vaginal discharge (thick white) found.       Dilation: Closed Effacement (%): 70 Station: -3 Exam by:: Artelia Laroche CNM   Musculoskeletal: Normal range of motion. She exhibits edema.  Neurological: She is alert and oriented to person, place, and time. She displays abnormal reflex (brisk).  Skin: Skin is warm and dry.  Psychiatric: She has a normal mood and affect.    Exam done for HSV history >>  No lesions or erethema noted  Prenatal labs: ABO, Rh: --/--/O POS (03/02 2210) Antibody:   Rubella:   RPR:    HBsAg:    HIV:    GBS:   Done 2 days ago, result not available.. Rapid test done  Results for orders placed during the hospital encounter of 06/23/12 (from the past  24 hour(s))  CBC     Status: Abnormal   Collection Time   06/23/12  2:30 AM      Component Value Range   WBC 8.1  4.0 - 10.5 K/uL   RBC 4.60  3.87 - 5.11 MIL/uL   Hemoglobin 10.5 (*) 12.0 - 15.0 g/dL   HCT 02.7 (*) 25.3 - 66.4 %   MCV 70.7 (*) 78.0 - 100.0 fL   MCH 22.8 (*) 26.0 - 34.0 pg   MCHC 32.3  30.0 - 36.0 g/dL   RDW 40.3  47.4 - 25.9 %   Platelets 166  150 - 400 K/uL  COMPREHENSIVE METABOLIC PANEL     Status: Abnormal   Collection Time   06/23/12  2:30 AM      Component Value Range   Sodium 134 (*) 135 - 145 mEq/L   Potassium 3.8  3.5 - 5.1 mEq/L   Chloride 104  96 - 112 mEq/L   CO2 20  19 - 32 mEq/L   Glucose, Bld 82  70 - 99 mg/dL   BUN 14  6 - 23 mg/dL   Creatinine, Ser 5.63  0.50 - 1.10 mg/dL   Calcium 8.4  8.4 - 87.5 mg/dL   Total Protein 5.9 (*) 6.0 - 8.3 g/dL   Albumin 2.1 (*) 3.5 - 5.2 g/dL   AST 21  0 - 37 U/L   ALT 21  0 - 35 U/L   Alkaline Phosphatase 143 (*) 39 - 117 U/L   Total Bilirubin 0.1 (*) 0.3 - 1.2 mg/dL   GFR calc non Af Amer 81 (*) >90 mL/min   GFR calc Af Amer >90  >90 mL/min  URIC ACID     Status: Abnormal   Collection Time   06/23/12  2:30 AM      Component Value Range   Uric Acid, Serum 7.8 (*) 2.4 - 7.0 mg/dL  LACTATE DEHYDROGENASE     Status: Normal   Collection Time   06/23/12  2:30 AM      Component Value Range   LDH 159  94 - 250 U/L  URINALYSIS, ROUTINE W REFLEX MICROSCOPIC     Status: Abnormal   Collection Time   06/23/12  2:58 AM      Component Value Range   Color, Urine YELLOW  YELLOW   APPearance CLEAR  CLEAR   Specific Gravity, Urine 1.020  1.005 - 1.030   pH 6.0  5.0 - 8.0   Glucose, UA NEGATIVE  NEGATIVE mg/dL   Hgb urine dipstick MODERATE (*) NEGATIVE   Bilirubin Urine NEGATIVE  NEGATIVE   Ketones, ur NEGATIVE  NEGATIVE mg/dL   Protein, ur >643 (*) NEGATIVE mg/dL   Urobilinogen, UA 0.2  0.0 - 1.0 mg/dL   Nitrite NEGATIVE  NEGATIVE   Leukocytes, UA NEGATIVE  NEGATIVE  URINE MICROSCOPIC-ADD ON     Status: Abnormal    Collection Time   06/23/12  2:58 AM      Component Value Range   Squamous Epithelial / LPF FEW (*) RARE   WBC, UA 0-2  <3 WBC/hpf   RBC / HPF 3-6  <3 RBC/hpf   Bacteria, UA FEW (*)  RARE    Assessment/Plan: A:  SIUP at [redacted]w[redacted]d       Preeclampsia      Latent Phase labor  P:  Discussed with Dr Henderson Cloud      Admit for labor augmentation      Magnesium sulfate     Finis Hendricksen 06/23/2012, 4:02 AM

## 2012-06-23 NOTE — Progress Notes (Signed)
Pt comfortable on Pit.  Filed Vitals:   06/23/12 1858 06/23/12 1901 06/23/12 1931 06/23/12 2001  BP: 140/89 138/87 157/99 145/95  Pulse: 82 82 80 85  Temp:      TempSrc:      Resp: 18 18 18 18   Height:      Weight:      SpO2:       FHTs 120s gstv, NST R, no decels. Toco q3-4 SVE 2/50/-2, AROM clear.  I asked pt before AROM if she desired to continue with induction.  She strongly desires continued TOL.  BPs are very stable off meds, FHTs are reassuring and her cervix ripened enough for AROM.  I agree with pt that it is currently ok to continue induction.

## 2012-06-23 NOTE — MAU Provider Note (Signed)
  History     CSN: [id number]  Arrival date and time: 06/23/12 1191   First Provider Initiated Contact with Patient 06/23/12 0229      Chief Complaint  Patient presents with  . Contractions  . Chest Pain   HPI This is a 31 y.o. female at [redacted]w[redacted]d who presents with c/o painful contractions all day today, worse over past 2 hours.  Developed some epigastric/chest pain on the way here. Does report increased swelling, mild headache (occipital).  Denies visual changes or RUQ pain.   OB History    Grav Para Term Preterm Abortions TAB SAB Ect Mult Living   3 1 1  0 1 1 0 0 0 1      Past Medical History  Diagnosis Date  . No pertinent past medical history     Past Surgical History  Procedure Date  . Cesarean section     Family History  Problem Relation Age of Onset  . Anesthesia problems Neg Hx   . Hypotension Neg Hx   . Malignant hyperthermia Neg Hx   . Pseudochol deficiency Neg Hx   . Other Neg Hx     History  Substance Use Topics  . Smoking status: Never Smoker   . Smokeless tobacco: Not on file  . Alcohol Use: No    Allergies: No Known Allergies  Prescriptions prior to admission  Medication Sig Dispense Refill  . Docosahexaenoic Acid (DHA OMEGA 3 PO) Take 1 capsule by mouth daily.      . Prenatal Vit-Fe Fumarate-FA (PRENATAL MULTIVITAMIN) TABS Take 1 tablet by mouth daily.      Marland Kitchen triamcinolone ointment (KENALOG) 0.5 % Apply 1 application topically daily as needed. Eczema outbreak      . Fe Fum-FePoly-FA-Vit C-Vit B3 (INTEGRA F) 125-1 MG CAPS Take 1 tablet by mouth daily.  30 capsule  1    ROS See HPI  Physical Exam   Blood pressure 167/107, pulse 62, temperature 98 F (36.7 C), temperature source Oral, resp. rate 20, height 5\' 2"  (1.575 m), weight 194 lb 3.2 oz (88.089 kg), last menstrual period 10/13/2011, SpO2 100.00%.  Physical Exam  Constitutional: She is oriented to person, place, and time. She appears well-developed and well-nourished. No distress (but  uncomfortable).  Cardiovascular: Normal rate and regular rhythm.   Murmur (systolic) heard. Respiratory: Effort normal and breath sounds normal. No respiratory distress. She has no wheezes. She has no rales.  GI: Soft. She exhibits no distension and no mass. There is no tenderness. There is no rebound and no guarding.  Genitourinary: Vagina normal and uterus normal. No vaginal discharge found.  Musculoskeletal: Normal range of motion. She exhibits edema (1+2+ lower extremity).  Neurological: She is alert and oriented to person, place, and time. She displays abnormal reflex (brisk, 3+).  Skin: Skin is warm and dry.  Psychiatric: She has a normal mood and affect.   EKG:  Sinus brady (58), possible LA enlargement, low voltage QRS  MAU Course  Procedures  MDM PIH labs drawn, UA.  Assessment and Plan  Admit per Dr Henderson Cloud See H&P  Wynelle Bourgeois 06/23/2012, 2:42 AM

## 2012-06-23 NOTE — Anesthesia Procedure Notes (Signed)
Epidural Patient location during procedure: OB Start time: 06/23/2012 10:21 PM  Staffing Performed by: anesthesiologist   Preanesthetic Checklist Completed: patient identified, site marked, surgical consent, pre-op evaluation, timeout performed, IV checked, risks and benefits discussed and monitors and equipment checked  Epidural Patient position: sitting Prep: site prepped and draped and DuraPrep Patient monitoring: continuous pulse ox and blood pressure Approach: midline Injection technique: LOR air  Needle:  Needle type: Tuohy  Needle gauge: 17 G Needle length: 9 cm and 9 Needle insertion depth: 5.5 cm Catheter type: closed end flexible Catheter size: 19 Gauge Catheter at skin depth: 10.5 cm Test dose: negative  Assessment Events: blood not aspirated, injection not painful, no injection resistance, negative IV test and no paresthesia  Additional Notes Discussed risk of headache, infection, bleeding, nerve injury and failed or incomplete block.  Patient voices understanding and wishes to proceed. Reason for block:procedure for pain

## 2012-06-23 NOTE — Anesthesia Preprocedure Evaluation (Signed)
Anesthesia Evaluation  Patient identified by MRN, date of birth, ID band Patient awake    Reviewed: Allergy & Precautions, H&P , NPO status , Patient's Chart, lab work & pertinent test results, reviewed documented beta blocker date and time   History of Anesthesia Complications Negative for: history of anesthetic complications  Airway Mallampati: III TM Distance: >3 FB Neck ROM: full    Dental  (+) Teeth Intact   Pulmonary neg pulmonary ROS,  breath sounds clear to auscultation        Cardiovascular hypertension (preeclampsia on magnesium), Rhythm:regular Rate:Normal     Neuro/Psych negative neurological ROS  negative psych ROS   GI/Hepatic negative GI ROS, Neg liver ROS,   Endo/Other  Morbid obesity  Renal/GU negative Renal ROS     Musculoskeletal   Abdominal   Peds  Hematology negative hematology ROS (+)   Anesthesia Other Findings   Reproductive/Obstetrics (+) Pregnancy (h/o prior c/s x1)                           Anesthesia Physical Anesthesia Plan  ASA: III  Anesthesia Plan: Epidural   Post-op Pain Management:    Induction:   Airway Management Planned:   Additional Equipment:   Intra-op Plan:   Post-operative Plan:   Informed Consent: I have reviewed the patients History and Physical, chart, labs and discussed the procedure including the risks, benefits and alternatives for the proposed anesthesia with the patient or authorized representative who has indicated his/her understanding and acceptance.     Plan Discussed with:   Anesthesia Plan Comments:         Anesthesia Quick Evaluation

## 2012-06-23 NOTE — MAU Note (Signed)
Contractions all day Sat. Ctxs stronger and closer now. Chest pain on the way to the hosp.

## 2012-06-24 ENCOUNTER — Encounter (HOSPITAL_COMMUNITY): Payer: Self-pay | Admitting: *Deleted

## 2012-06-24 LAB — CBC
Hemoglobin: 10.2 g/dL — ABNORMAL LOW (ref 12.0–15.0)
MCHC: 32.1 g/dL (ref 30.0–36.0)
RDW: 15.1 % (ref 11.5–15.5)
WBC: 11.2 10*3/uL — ABNORMAL HIGH (ref 4.0–10.5)

## 2012-06-24 LAB — PROTEIN, URINE, RANDOM: Total Protein, Urine: 168 mg/dL

## 2012-06-24 LAB — MRSA PCR SCREENING: MRSA by PCR: NEGATIVE

## 2012-06-24 MED ORDER — LACTATED RINGERS IV SOLN: INTRAVENOUS | Status: DC

## 2012-06-24 MED ORDER — SODIUM CHLORIDE 0.9 % IV SOLN: 250.0000 mL | INTRAVENOUS | Status: DC | PRN

## 2012-06-24 MED ORDER — WITCH HAZEL-GLYCERIN EX PADS: 1.0000 "application " | MEDICATED_PAD | CUTANEOUS | Status: DC | PRN

## 2012-06-24 MED ORDER — ZOLPIDEM TARTRATE 5 MG PO TABS: 5.0000 mg | ORAL_TABLET | Freq: Every evening | ORAL | Status: DC | PRN

## 2012-06-24 MED ORDER — ONDANSETRON HCL 4 MG/2ML IJ SOLN: 4.0000 mg | INTRAMUSCULAR | Status: DC | PRN

## 2012-06-24 MED ORDER — TETANUS-DIPHTH-ACELL PERTUSSIS 5-2.5-18.5 LF-MCG/0.5 IM SUSP
0.5000 mL | Freq: Once | INTRAMUSCULAR | Status: DC
  Filled 2012-06-24: qty 0.5

## 2012-06-24 MED ORDER — SIMETHICONE 80 MG PO CHEW: 80.0000 mg | CHEWABLE_TABLET | ORAL | Status: DC | PRN

## 2012-06-24 MED ORDER — SODIUM CHLORIDE 0.9 % IJ SOLN
3.0000 mL | Freq: Two times a day (BID) | INTRAMUSCULAR | Status: DC
  Administered 2012-06-24: 3 mL via INTRAVENOUS

## 2012-06-24 MED ORDER — PRENATAL MULTIVITAMIN CH
1.0000 | ORAL_TABLET | Freq: Every day | ORAL | Status: DC
  Administered 2012-06-24 – 2012-06-26 (×3): 1 via ORAL
  Filled 2012-06-24 (×3): qty 1

## 2012-06-24 MED ORDER — DIBUCAINE 1 % RE OINT: 1.0000 "application " | TOPICAL_OINTMENT | RECTAL | Status: DC | PRN

## 2012-06-24 MED ORDER — DIPHENHYDRAMINE HCL 25 MG PO CAPS: 25.0000 mg | ORAL_CAPSULE | Freq: Four times a day (QID) | ORAL | Status: DC | PRN

## 2012-06-24 MED ORDER — LACTATED RINGERS IV SOLN
INTRAVENOUS | Status: AC
  Administered 2012-06-24: 14:00:00 via INTRAVENOUS

## 2012-06-24 MED ORDER — ONDANSETRON HCL 4 MG PO TABS: 4.0000 mg | ORAL_TABLET | ORAL | Status: DC | PRN

## 2012-06-24 MED ORDER — FERROUS SULFATE 325 (65 FE) MG PO TABS
325.0000 mg | ORAL_TABLET | Freq: Two times a day (BID) | ORAL | Status: DC
  Administered 2012-06-24 – 2012-06-26 (×4): 325 mg via ORAL
  Filled 2012-06-24 (×4): qty 1

## 2012-06-24 MED ORDER — LANOLIN HYDROUS EX OINT: TOPICAL_OINTMENT | CUTANEOUS | Status: DC | PRN

## 2012-06-24 MED ORDER — METHYLERGONOVINE MALEATE 0.2 MG/ML IJ SOLN: 0.2000 mg | INTRAMUSCULAR | Status: DC | PRN

## 2012-06-24 MED ORDER — MAGNESIUM SULFATE 40 G IN LACTATED RINGERS - SIMPLE: 2.0000 g/h | INTRAVENOUS | Status: DC

## 2012-06-24 MED ORDER — BENZOCAINE-MENTHOL 20-0.5 % EX AERO
1.0000 "application " | INHALATION_SPRAY | CUTANEOUS | Status: DC | PRN
  Administered 2012-06-24: 1 via TOPICAL
  Filled 2012-06-24: qty 56

## 2012-06-24 MED ORDER — SODIUM CHLORIDE 0.9 % IJ SOLN
3.0000 mL | INTRAMUSCULAR | Status: DC | PRN
  Administered 2012-06-24: 3 mL via INTRAVENOUS

## 2012-06-24 MED ORDER — MAGNESIUM SULFATE 40 G IN LACTATED RINGERS - SIMPLE
2.0000 g/h | INTRAVENOUS | Status: AC
  Filled 2012-06-24: qty 500

## 2012-06-24 MED ORDER — TRIAMCINOLONE ACETONIDE 0.5 % EX OINT: 1.0000 "application " | TOPICAL_OINTMENT | Freq: Every day | CUTANEOUS | Status: DC | PRN

## 2012-06-24 MED ORDER — MEASLES, MUMPS & RUBELLA VAC ~~LOC~~ INJ
0.5000 mL | INJECTION | Freq: Once | SUBCUTANEOUS | Status: DC
  Filled 2012-06-24: qty 0.5

## 2012-06-24 MED ORDER — SENNOSIDES-DOCUSATE SODIUM 8.6-50 MG PO TABS
2.0000 | ORAL_TABLET | Freq: Every day | ORAL | Status: DC
  Administered 2012-06-24 – 2012-06-25 (×2): 2 via ORAL

## 2012-06-24 MED ORDER — MAGNESIUM HYDROXIDE 400 MG/5ML PO SUSP
30.0000 mL | ORAL | Status: DC | PRN
  Filled 2012-06-24: qty 30

## 2012-06-24 MED ORDER — IBUPROFEN 800 MG PO TABS
800.0000 mg | ORAL_TABLET | Freq: Three times a day (TID) | ORAL | Status: DC
  Administered 2012-06-24 – 2012-06-26 (×7): 800 mg via ORAL
  Filled 2012-06-24 (×7): qty 1

## 2012-06-24 MED ORDER — NIFEDIPINE ER 30 MG PO TB24
30.0000 mg | ORAL_TABLET | Freq: Every day | ORAL | Status: DC
  Administered 2012-06-24 – 2012-06-26 (×3): 30 mg via ORAL
  Filled 2012-06-24 (×5): qty 1

## 2012-06-24 MED ORDER — METHYLERGONOVINE MALEATE 0.2 MG PO TABS: 0.2000 mg | ORAL_TABLET | ORAL | Status: DC | PRN

## 2012-06-24 MED ORDER — OXYCODONE-ACETAMINOPHEN 5-325 MG PO TABS: 1.0000 | ORAL_TABLET | ORAL | Status: DC | PRN

## 2012-06-24 NOTE — Anesthesia Postprocedure Evaluation (Signed)
  Anesthesia Post-op Note  Patient: Lisa Singh  Procedure(s) Performed: * No procedures listed *  Patient Location: A-ICU  Anesthesia Type: Epidural  Level of Consciousness: awake  Airway and Oxygen Therapy: Patient Spontanous Breathing  Post-op Pain: mild  Post-op Assessment: Post-op Vital signs reviewed  Post-op Vital Signs: Reviewed and stable  Complications: No apparent anesthesia complications

## 2012-06-24 NOTE — Progress Notes (Signed)
UR chart review completed.  

## 2012-06-24 NOTE — Progress Notes (Addendum)
Patient is doing well.  BP's in 140/80-90 range.  Good urine output.  IV magnesium stopped at 1800 (12 hours post partum).  Will transfer to post partum.  I have ordered Procardia XL 30 mg daily for blood pressure control.

## 2012-06-25 LAB — CBC
Hemoglobin: 8 g/dL — ABNORMAL LOW (ref 12.0–15.0)
MCHC: 32.9 g/dL (ref 30.0–36.0)
RDW: 15.2 % (ref 11.5–15.5)
WBC: 9.9 10*3/uL (ref 4.0–10.5)

## 2012-06-26 LAB — TYPE AND SCREEN: Unit division: 0

## 2012-06-26 MED ORDER — NIFEDIPINE ER 30 MG PO TB24: 30.0000 mg | ORAL_TABLET | Freq: Every day | ORAL | Status: DC

## 2012-06-26 NOTE — Progress Notes (Signed)
Discharge instructions reviewed with patient.  Patient voiced understanding.   

## 2012-06-26 NOTE — Clinical Social Work Maternal (Signed)
    Clinical Social Work Department PSYCHOSOCIAL ASSESSMENT - MATERNAL/CHILD 06/25/2012  Patient:  Lisa Singh,Lisa Singh  Account Number:  [id number]  Admit Date:  06/23/2012  Marjo Bicker Name:    Clinical Social Worker:  Lulu Riding, LCSW   Date/Time:  06/25/2012 02:00 PM  Date Referred:  06/25/2012   Referral source  NICU     Referred reason  NICU   Other referral source:    I:  FAMILY / HOME ENVIRONMENT Child's legal guardian:  PARENT  Guardian - Name Guardian - Age Guardian - Address  Lisa Singh [address] [place]  [address]  [place]   Other household support members/support persons Other support:   2 Other children, 7yr girl and 4 yr girl    II  PSYCHOSOCIAL DATA Information Source:  Family Interview  Surveyor, quantity and [employer] Employment:   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 Eddie Candle   IV  RISK FACTORS AND CURRENT PROBLEMS Current Problem:  None   Risk Factor & Current Problem Patient Issue Family Issue Risk Factor / Current Problem Comment   N N     V  SOCIAL WORK ASSESSMENT Sw intern and Lulu Riding, LCSW visited parents due to baby's admission to NICU. Advised both MOB and FOB reason for visit and support services offered by NICU SW. Sw intern asked of feelings around NICU admission. MOB and FOB explained to SWs that baby was in NICU being monitored and may be able to discharge with MOB tomorrow if  feeding properly. FOB and MOB expressed their dissatisfaction with the nurse who was caring for their baby, they stated they had the nurse change after comments about MOB "worrying too much". MOB and FOB now satisfied with nurse caring for baby, they say the change has relieved them of a little stress. SW has no social concerns at this time.  Parents were friendly and talkative and seemed to appreciate SW's visit.      VI SOCIAL WORK PLAN Social Work Plan  Psychosocial Support/Ongoing Assessment of Needs   Type of pt/family education:   Discussed common emotions related to NICU experience.   If child protective services report - county:   If child protective services report - date:   Information/referral to community resources comment:   None identified   Other social work plan:   Follow up with famiy until discharge to ensure adequate quality of care is being met.  Provide support as needed

## 2012-06-26 NOTE — Discharge Summary (Addendum)
Obstetric Discharge Summary Reason for Admission: induction of labor, severe preeclampsia Prenatal Procedures: Preeclampsia Intrapartum Procedures: spontaneous vaginal delivery, VBAC Postpartum Procedures: none Complications-Operative and Postpartum: 2nd degree hymenal laceration Hemoglobin  Date Value Range Status  06/25/2012 8.0* 12.0 - 15.0 g/dL Final     DELTA CHECK NOTED     REPEATED TO VERIFY     HCT  Date Value Range Status  06/25/2012 24.3* 36.0 - 46.0 % Final    Physical Exam:  BP: 130-140/80-90 range General: alert Lochia: appropriate Uterine Fundus: firm  Discharge Diagnoses:  Late preterm (36/2 weeks) Pregnancy-delivered, Severe preelampsia and VBAC  Discharge Information: Date: 06/26/2012 Activity: pelvic rest Diet: routine Medications: PNV, Ibuprofen and Procardia XL 30 mg Condition: stable Instructions: refer to practice specific booklet Discharge to: home Follow-up Information    Follow up with HORVATH,MICHELLE A, MD. In 4 weeks.   Contact information:   [address] [place] [address] [phone]          Newborn Data: Live born female  Birth Weight: 5 lb 1 oz (2296 g) APGAR: 3, 8  Being observed in NICU with low glucose and poor feeding.  Raissa Dam D 06/26/2012, 10:03 AM

## 2012-06-26 NOTE — Progress Notes (Signed)
Pt ambulated out to nicu

## 2014-07-20 ENCOUNTER — Encounter (HOSPITAL_COMMUNITY): Payer: Self-pay | Admitting: *Deleted

## 2014-07-26 ENCOUNTER — Emergency Department (HOSPITAL_COMMUNITY)
Admission: EM | Admit: 2014-07-26 | Discharge: 2014-07-26 | Disposition: A | Discharge status: Home / Self Care | Payer: BC Managed Care – PPO | Source: Home / Self Care

## 2014-07-26 ENCOUNTER — Encounter (HOSPITAL_COMMUNITY): Payer: Self-pay | Admitting: *Deleted

## 2014-07-26 DIAGNOSIS — J069 Acute upper respiratory infection, unspecified: Secondary | ICD-10-CM

## 2014-07-26 DIAGNOSIS — H6123 Impacted cerumen, bilateral: Secondary | ICD-10-CM

## 2014-07-26 MED ORDER — AZITHROMYCIN 250 MG PO TABS: ORAL_TABLET | ORAL | Status: DC

## 2014-07-26 MED ORDER — IPRATROPIUM BROMIDE 0.06 % NA SOLN: 2.0000 | Freq: Four times a day (QID) | NASAL | Status: DC

## 2014-07-26 NOTE — ED Provider Notes (Signed)
CSN: [id number]     Arrival date & time 07/26/14  1248 History   None    Chief Complaint  Patient presents with  . Sore Throat   (Consider location/radiation/quality/duration/timing/severity/associated sxs/prior Treatment) Patient is a 33 y.o. female presenting with pharyngitis. The history is provided by the patient.  Sore Throat This is a new problem. The current episode started more than 2 days ago. The problem has been gradually worsening. Pertinent negatives include no chest pain and no abdominal pain. The symptoms are aggravated by swallowing.    Past Medical History  Diagnosis Date  . No pertinent past medical history    Past Surgical History  Procedure Laterality Date  . Cesarean section     Family History  Problem Relation Age of Onset  . Anesthesia problems Neg Hx   . Hypotension Neg Hx   . Malignant hyperthermia Neg Hx   . Pseudochol deficiency Neg Hx   . Other Neg Hx    History  Substance Use Topics  . Smoking status: Never Smoker   . Smokeless tobacco: Not on file  . Alcohol Use: No   OB History    Gravida Para Term Preterm AB TAB SAB Ectopic Multiple Living   3 2 1 1 1 1  0 0 0 2     Review of Systems  Constitutional: Negative for fever and chills.  HENT: Positive for congestion, ear pain and rhinorrhea.   Respiratory: Negative for cough.   Cardiovascular: Negative for chest pain.  Gastrointestinal: Negative.  Negative for abdominal pain.  Genitourinary: Negative.     Allergies  Review of patient's allergies indicates no known allergies.  Home Medications   Prior to Admission medications   Medication Sig Start Date End Date Taking? Authorizing Provider  azithromycin (ZITHROMAX Z-PAK) 250 MG tablet Take as directed on pack 07/26/14   Linna HoffJames D Kindl, MD  ipratropium (ATROVENT) 0.06 % nasal spray Place 2 sprays into the nose 4 (four) times daily. 07/26/14   Linna HoffJames D Kindl, MD  NIFEdipine (PROCARDIA-XL/ADALAT CC) 30 MG 24 hr tablet Take 1 tablet (30 mg  total) by mouth daily. 06/26/12   Mickel Baasichard D Kaplan, MD  Prenatal Vit-Fe Fumarate-FA (PRENATAL MULTIVITAMIN) TABS Take 1 tablet by mouth daily.    Historical Provider, MD  triamcinolone ointment (KENALOG) 0.5 % Apply 1 application topically daily as needed. Eczema outbreak    Historical Provider, MD   BP 128/73 mmHg  Pulse 83  Temp(Src) 98.9 F (37.2 C) (Oral)  Resp 16  SpO2 99%  LMP 06/24/2014 (LMP Unknown) Physical Exam  Constitutional: She appears well-developed and well-nourished.  HENT:  Ears:  Mouth/Throat: Uvula is midline and mucous membranes are normal. Posterior oropharyngeal erythema present. No oropharyngeal exudate or posterior oropharyngeal edema.  Eyes: Pupils are equal, round, and reactive to light.  Neck: Normal range of motion. Neck supple.  Lymphadenopathy:    She has no cervical adenopathy.  Vitals reviewed.   ED Course  Procedures (including critical care time) Labs Review Labs Reviewed - No data to display  Imaging Review No results found.   MDM   1. URI (upper respiratory infection)   2. Cerumen impaction, bilateral        Linna HoffJames D Kindl, MD 07/26/14 1334

## 2014-07-26 NOTE — Discharge Instructions (Signed)
Drink plenty of fluids as discussed, use medicine as prescribed, and mucinex or delsym for cough. Return or see your doctor if further problems °

## 2014-07-26 NOTE — ED Notes (Signed)
Pt     Requesting        meds  Be  Phoned  In to  [facility]       [place]      [address]

## 2014-07-26 NOTE — ED Notes (Signed)
Pt  Reports   sorethroat     And  r  Earache  With  Symptoms     X  4  Days    pT REPORTS  IT  HURTS  TO  SWALLOW   SHE  IS  SITTING  UPRIGHT ON THE  EXAM TABLE  SPEAKING IN  COMPLETE  SENTANCES

## 2014-08-17 NOTE — ED Notes (Signed)
Attempted to return call . Call went to voice mail . Mail box full , unable to leave message

## 2015-01-09 ENCOUNTER — Emergency Department (INDEPENDENT_AMBULATORY_CARE_PROVIDER_SITE_OTHER)
Admission: EM | Admit: 2015-01-09 | Discharge: 2015-01-09 | Disposition: A | Discharge status: Home / Self Care | Payer: Managed Care, Other (non HMO) | Source: Home / Self Care | Attending: Family Medicine | Admitting: Family Medicine

## 2015-01-09 ENCOUNTER — Encounter (HOSPITAL_COMMUNITY): Payer: Self-pay | Admitting: Emergency Medicine

## 2015-01-09 DIAGNOSIS — H60392 Other infective otitis externa, left ear: Secondary | ICD-10-CM | POA: Diagnosis not present

## 2015-01-09 LAB — POCT PREGNANCY, URINE: PREG TEST UR: NEGATIVE

## 2015-01-09 MED ORDER — FLUCONAZOLE 150 MG PO TABS: 150.0000 mg | ORAL_TABLET | Freq: Once | ORAL | Status: DC

## 2015-01-09 MED ORDER — SULFAMETHOXAZOLE-TRIMETHOPRIM 800-160 MG PO TABS: 2.0000 | ORAL_TABLET | Freq: Two times a day (BID) | ORAL | Status: DC

## 2015-01-09 MED ORDER — NEOMYCIN-POLYMYXIN-HC 3.5-10000-1 OT SUSP: 4.0000 [drp] | Freq: Three times a day (TID) | OTIC | Status: DC

## 2015-01-09 NOTE — Discharge Instructions (Signed)
Thank you for coming in today. The Bactrim and use the Cortisporin drops. Use birth control of taking these medications. Return as needed.   Otitis Externa Otitis externa is a bacterial or fungal infection of the outer ear canal. This is the area from the eardrum to the outside of the ear. Otitis externa is sometimes called "swimmer's ear." CAUSES  Possible causes of infection include:  Swimming in dirty water.  Moisture remaining in the ear after swimming or bathing.  Mild injury (trauma) to the ear.  Objects stuck in the ear (foreign body).  Cuts or scrapes (abrasions) on the outside of the ear. SIGNS AND SYMPTOMS  The first symptom of infection is often itching in the ear canal. Later signs and symptoms may include swelling and redness of the ear canal, ear pain, and yellowish-white fluid (pus) coming from the ear. The ear pain may be worse when pulling on the earlobe. DIAGNOSIS  Your health care provider will perform a physical exam. A sample of fluid may be taken from the ear and examined for bacteria or fungi. TREATMENT  Antibiotic ear drops are often given for 10 to 14 days. Treatment may also include pain medicine or corticosteroids to reduce itching and swelling. HOME CARE INSTRUCTIONS   Apply antibiotic ear drops to the ear canal as prescribed by your health care provider.  Take medicines only as directed by your health care provider.  If you have diabetes, follow any additional treatment instructions from your health care provider.  Keep all follow-up visits as directed by your health care provider. PREVENTION   Keep your ear dry. Use the corner of a towel to absorb water out of the ear canal after swimming or bathing.  Avoid scratching or putting objects inside your ear. This can damage the ear canal or remove the protective wax that lines the canal. This makes it easier for bacteria and fungi to grow.  Avoid swimming in lakes, polluted water, or poorly  chlorinated pools.  You may use ear drops made of rubbing alcohol and vinegar after swimming. Combine equal parts of white vinegar and alcohol in a bottle. Put 3 or 4 drops into each ear after swimming. SEEK MEDICAL CARE IF:   You have a fever.  Your ear is still red, swollen, painful, or draining pus after 3 days.  Your redness, swelling, or pain gets worse.  You have a severe headache.  You have redness, swelling, pain, or tenderness in the area behind your ear. MAKE SURE YOU:   Understand these instructions.  Will watch your condition.  Will get help right away if you are not doing well or get worse. Document Released: 09/04/2005 Document Revised: 01/19/2014 Document Reviewed: 09/21/2011 [facility] Patient Information 2015 [place], [place]. This information is not intended to replace advice given to you by your health care provider. Make sure you discuss any questions you have with your health care provider.

## 2015-01-09 NOTE — ED Provider Notes (Signed)
Lisa NasutiCamillia Singh is a 34 y.o. female who presents to Urgent Care today for left ear pain and swelling for 4 days. Skin is tender and swollen without drainage no change in hearing fevers chills nausea vomiting or diarrhea. No treatment tried yet. Patient Frequent gets yeast infections with antibiotics.   Past Medical History  Diagnosis Date  . No pertinent past medical history    Past Surgical History  Procedure Laterality Date  . Cesarean section     History  Substance Use Topics  . Smoking status: Never Smoker   . Smokeless tobacco: Not on file  . Alcohol Use: No   ROS as above Medications: No current facility-administered medications for this encounter.   Current Outpatient Prescriptions  Medication Sig Dispense Refill  . azithromycin (ZITHROMAX Z-PAK) 250 MG tablet Take as directed on pack 6 tablet 0  . fluconazole (DIFLUCAN) 150 MG tablet Take 1 tablet (150 mg total) by mouth once. 1 tablet 1  . ipratropium (ATROVENT) 0.06 % nasal spray Place 2 sprays into the nose 4 (four) times daily. 15 mL 12  . neomycin-polymyxin-hydrocortisone (CORTISPORIN) 3.5-10000-1 otic suspension Place 4 drops into the left ear 3 (three) times daily. 10 mL 0  . NIFEdipine (PROCARDIA-XL/ADALAT CC) 30 MG 24 hr tablet Take 1 tablet (30 mg total) by mouth daily. 30 tablet 2  . Prenatal Vit-Fe Fumarate-FA (PRENATAL MULTIVITAMIN) TABS Take 1 tablet by mouth daily.    Marland Kitchen. sulfamethoxazole-trimethoprim (BACTRIM DS,SEPTRA DS) 800-160 MG per tablet Take 2 tablets by mouth 2 (two) times daily. 28 tablet 0  . triamcinolone ointment (KENALOG) 0.5 % Apply 1 application topically daily as needed. Eczema outbreak     No Known Allergies   Exam:  BP 106/74 mmHg  Pulse 84  Temp(Src) 98.1 F (36.7 C) (Oral)  Resp 16  SpO2 100% Gen: Well NAD HEENT: EOMI,  MMM swelling of the left ear canal and tragus. Right is normal. Tender to touch. Lungs: Normal work of breathing. CTABL Heart: RRR no MRG Abd: NABS, Soft.  Nondistended, Nontender Exts: Brisk capillary refill, warm and well perfused.   Results for orders placed or performed during the hospital encounter of 01/09/15 (from the past 24 hour(s))  Pregnancy, urine POC     Status: None   Collection Time: 01/09/15  4:00 PM  Result Value Ref Range   Preg Test, Ur NEGATIVE NEGATIVE   No results found.  Assessment and Plan: 34 y.o. female with otitis externa versus cellulitis. Treat with Bactrim and Polytrim drops. Fluconazole for yeast infection prevention.  Discussed warning signs or symptoms. Please see discharge instructions. Patient expresses understanding.     Rodolph BongEvan S Ladarious Kresse, MD 01/09/15 [phone]

## 2015-01-09 NOTE — ED Notes (Signed)
C/o left ear pain States ear is swelling  And has pain States during the summer time ear does drain States pain is radiating down to neck

## 2015-02-17 ENCOUNTER — Ambulatory Visit: Payer: Managed Care, Other (non HMO) | Admitting: Family

## 2015-02-17 DIAGNOSIS — Z0289 Encounter for other administrative examinations: Secondary | ICD-10-CM

## 2015-03-11 ENCOUNTER — Ambulatory Visit: Payer: Managed Care, Other (non HMO) | Admitting: Family

## 2015-03-11 ENCOUNTER — Encounter: Payer: Self-pay | Admitting: Family

## 2015-03-11 ENCOUNTER — Ambulatory Visit (INDEPENDENT_AMBULATORY_CARE_PROVIDER_SITE_OTHER): Payer: Managed Care, Other (non HMO) | Admitting: Family

## 2015-03-11 VITALS — BP 110/78 | HR 87 | Temp 98.6°F | Resp 18 | Ht 62.0 in | Wt 136.0 lb

## 2015-03-11 DIAGNOSIS — J302 Other seasonal allergic rhinitis: Secondary | ICD-10-CM

## 2015-03-11 DIAGNOSIS — M549 Dorsalgia, unspecified: Secondary | ICD-10-CM | POA: Diagnosis not present

## 2015-03-11 DIAGNOSIS — D649 Anemia, unspecified: Secondary | ICD-10-CM | POA: Insufficient documentation

## 2015-03-11 NOTE — Progress Notes (Signed)
   Subjective:    [name], female    DOB: 09-Mar-1981, 34 y.o.   MRN: [id number]  Chief Complaint  Patient presents with  . Establish Care    HPI:  Lisa Singh is a 34 y.o. female with a PMH of seasonal allergies, urinary tract infections, or anemia who presents today for an office visit to establish care.   1.) Seasonal allergies - Symptoms occur mostly in the fall and are stably managed with Claratin during that time. Notes that she does get ear infections 1-2 times per year.   2.)  Anemia - Recently had her blood work checked and notes that her iron levels were decreased and has restarted taking liquid iron. Denies mennorhagia. Does not eat a lot of red meat. Currently stable and not experiencing any symptoms.   3.) Mid back - Associated symptom of pain located in her mid back has been going on for about 1 month. Describes the pain as dull and achy. Does not effect her functionality. Modifying factors include adjusting posture   Allergies  Allergen Reactions  . Penicillins Hives    No current outpatient prescriptions on file prior to visit.   No current facility-administered medications on file prior to visit.    Review of Systems  Constitutional: Negative for fever and chills.  Respiratory: Negative for chest tightness and shortness of breath.   Neurological: Negative for dizziness and light-headedness.      Objective:    BP 110/78 mmHg  Pulse 87  Temp(Src) 98.6 F (37 C) (Oral)  Resp 18  Ht 5\' 2"  (1.575 m)  Wt 136 lb (61.689 kg)  BMI 24.87 kg/m2  SpO2 98% Nursing note and vital signs reviewed.  Physical Exam  Constitutional: She is oriented to person, place, and time. She appears well-developed and well-nourished. No distress.  Cardiovascular: Normal rate, regular rhythm, normal heart sounds and intact distal pulses.   Pulmonary/Chest: Effort normal and breath sounds normal.  Musculoskeletal:  No obvious deformity, discoloration, or edema  pack noted. Mild tenderness along the left lateral mid thoracic paraspinal musculature. Range of motion is completely intact. Distal pulses, sensation, and reflexes are intact and appropriate.  Neurological: She is alert and oriented to person, place, and time.  Skin: Skin is warm and dry.  Psychiatric: She has a normal mood and affect. Her behavior is normal. Judgment and thought content normal.       Assessment & Plan:   Problem List Items Addressed This Visit      Respiratory   Seasonal allergies    Stable with current usage of Claritin as needed. Continue current dosage of Claritin and follow-up if symptoms worsen or fail to improve.        Other   Anemia - Primary    History of iron deficiency anemia and blood work was recently performed. Patient does not recall exact numbers. She is currently taking liquid iron supplementation. Follow-up in 3 months to check her CBC.      Mid back pain    Symptoms and exam consistent with low back muscle tightness. Recommend ergonomic assessment at work to optimize position as she indicates she sits forward most of the day. Treat conservatively at this time with heat, stretching, and over-the-counter anti-inflammatories as needed for discomfort. Follow-up if symptoms worsen or fail to improve.

## 2015-03-11 NOTE — Patient Instructions (Signed)
Thank you for choosing [facility].  Summary/Instructions:  Recommend an Engineer, maintenance (IT) for work.  Heat and stretching as needed.   If your symptoms worsen or fail to improve, please contact our office for further instruction, or in case of emergency go directly to the emergency room at the closest medical facility.   Iron Deficiency Anemia Anemia is a condition in which there are less red blood cells or hemoglobin in the blood than normal. Hemoglobin is the part of red blood cells that carries oxygen. Iron deficiency anemia is anemia caused by too little iron. It is the most common type of anemia. It may leave you tired and short of breath. CAUSES   Lack of iron in the diet.  Poor absorption of iron, as seen with intestinal disorders.  Intestinal bleeding.  Heavy periods. SIGNS AND SYMPTOMS  Mild anemia may not be noticeable. Symptoms may include:  Fatigue.  Headache.  Pale skin.  Weakness.  Tiredness.  Shortness of breath.  Dizziness.  Cold hands and feet.  Fast or irregular heartbeat. DIAGNOSIS  Diagnosis requires a thorough evaluation and physical exam by your health care provider. Blood tests are generally used to confirm iron deficiency anemia. Additional tests may be done to find the underlying cause of your anemia. These may include:  Testing for blood in the stool (fecal occult blood test).  A procedure to see inside the colon and rectum (colonoscopy).  A procedure to see inside the esophagus and stomach (endoscopy). TREATMENT  Iron deficiency anemia is treated by correcting the cause of the deficiency. Treatment may involve:  Adding iron-rich foods to your diet.  Taking iron supplements. Pregnant or breastfeeding women need to take extra iron because their normal diet usually does not provide the required amount.  Taking vitamins. Vitamin C improves the absorption of iron. Your health care provider may recommend that you take your iron  tablets with a glass of orange juice or vitamin C supplement.  Medicines to make heavy menstrual flow lighter.  Surgery. HOME CARE INSTRUCTIONS   Take iron as directed by your health care provider.  If you cannot tolerate taking iron supplements by mouth, talk to your health care provider about taking them through a vein (intravenously) or an injection into a muscle.  For the best iron absorption, iron supplements should be taken on an empty stomach. If you cannot tolerate them on an empty stomach, you may need to take them with food.  Do not drink milk or take antacids at the same time as your iron supplements. Milk and antacids may interfere with the absorption of iron.  Iron supplements can cause constipation. Make sure to include fiber in your diet to prevent constipation. A stool softener may also be recommended.  Take vitamins as directed by your health care provider.  Eat a diet rich in iron. Foods high in iron include liver, lean beef, whole-grain bread, eggs, dried fruit, and dark green leafy vegetables. SEEK IMMEDIATE MEDICAL CARE IF:   You faint. If this happens, do not drive. Call your local emergency services (911 in [place] if no other help is available.  You have chest pain.  You feel nauseous or vomit.  You have severe or increased shortness of breath with activity.  You feel weak.  You have a rapid heartbeat.  You have unexplained sweating.  You become light-headed when getting up from a chair or bed. MAKE SURE YOU:   Understand these instructions.  Will watch your condition.  Will get  help right away if you are not doing well or get worse. Document Released: 09/01/2000 Document Revised: 09/09/2013 Document Reviewed: 05/12/2013 [facility] Patient Information 2015 [place], [place]. This information is not intended to replace advice given to you by your health care provider. Make sure you discuss any questions you have with your health care provider.

## 2015-03-11 NOTE — Assessment & Plan Note (Signed)
History of iron deficiency anemia and blood work was recently performed. Patient does not recall exact numbers. She is currently taking liquid iron supplementation. Follow-up in 3 months to check her CBC.

## 2015-03-11 NOTE — Progress Notes (Signed)
Pre visit review using our clinic review tool, if applicable. No additional management support is needed unless otherwise documented below in the visit note. 

## 2015-03-11 NOTE — Assessment & Plan Note (Signed)
Stable with current usage of Claritin as needed. Continue current dosage of Claritin and follow-up if symptoms worsen or fail to improve.

## 2015-03-11 NOTE — Assessment & Plan Note (Signed)
Symptoms and exam consistent with low back muscle tightness. Recommend ergonomic assessment at work to optimize position as she indicates she sits forward most of the day. Treat conservatively at this time with heat, stretching, and over-the-counter anti-inflammatories as needed for discomfort. Follow-up if symptoms worsen or fail to improve.

## 2015-03-12 ENCOUNTER — Encounter: Payer: Self-pay | Admitting: Nurse Practitioner

## 2015-04-06 ENCOUNTER — Other Ambulatory Visit (INDEPENDENT_AMBULATORY_CARE_PROVIDER_SITE_OTHER): Payer: Managed Care, Other (non HMO)

## 2015-04-06 ENCOUNTER — Encounter: Payer: Self-pay | Admitting: Internal Medicine

## 2015-04-06 ENCOUNTER — Ambulatory Visit (INDEPENDENT_AMBULATORY_CARE_PROVIDER_SITE_OTHER)
Admission: RE | Admit: 2015-04-06 | Discharge: 2015-04-06 | Disposition: A | Discharge status: Home / Self Care | Payer: Managed Care, Other (non HMO) | Source: Ambulatory Visit | Attending: Internal Medicine | Admitting: Internal Medicine

## 2015-04-06 ENCOUNTER — Ambulatory Visit (INDEPENDENT_AMBULATORY_CARE_PROVIDER_SITE_OTHER): Payer: Managed Care, Other (non HMO) | Admitting: Internal Medicine

## 2015-04-06 VITALS — BP 120/80 | HR 86 | Temp 98.5°F | Resp 16 | Ht 62.0 in | Wt 139.0 lb

## 2015-04-06 DIAGNOSIS — R1012 Left upper quadrant pain: Secondary | ICD-10-CM

## 2015-04-06 DIAGNOSIS — G8929 Other chronic pain: Secondary | ICD-10-CM

## 2015-04-06 DIAGNOSIS — R109 Unspecified abdominal pain: Principal | ICD-10-CM

## 2015-04-06 DIAGNOSIS — K5900 Constipation, unspecified: Secondary | ICD-10-CM | POA: Diagnosis not present

## 2015-04-06 DIAGNOSIS — D500 Iron deficiency anemia secondary to blood loss (chronic): Secondary | ICD-10-CM

## 2015-04-06 LAB — CBC WITH DIFFERENTIAL/PLATELET
BASOS PCT: 0.8 % (ref 0.0–3.0)
Basophils Absolute: 0 10*3/uL (ref 0.0–0.1)
EOS PCT: 1.6 % (ref 0.0–5.0)
Eosinophils Absolute: 0.1 10*3/uL (ref 0.0–0.7)
HCT: 35.7 % — ABNORMAL LOW (ref 36.0–46.0)
Hemoglobin: 11.3 g/dL — ABNORMAL LOW (ref 12.0–15.0)
LYMPHS ABS: 1.5 10*3/uL (ref 0.7–4.0)
Lymphocytes Relative: 44.1 % (ref 12.0–46.0)
MCHC: 31.6 g/dL (ref 30.0–36.0)
MCV: 71.3 fl — ABNORMAL LOW (ref 78.0–100.0)
MONO ABS: 0.3 10*3/uL (ref 0.1–1.0)
Monocytes Relative: 7.6 % (ref 3.0–12.0)
Neutro Abs: 1.5 10*3/uL (ref 1.4–7.7)
Neutrophils Relative %: 45.9 % (ref 43.0–77.0)
Platelets: 291 10*3/uL (ref 150.0–400.0)
RBC: 5 Mil/uL (ref 3.87–5.11)
RDW: 14.7 % (ref 11.5–15.5)
WBC: 3.4 10*3/uL — ABNORMAL LOW (ref 4.0–10.5)

## 2015-04-06 LAB — URINALYSIS, ROUTINE W REFLEX MICROSCOPIC
Bilirubin Urine: NEGATIVE
Ketones, ur: NEGATIVE
LEUKOCYTES UA: NEGATIVE
Nitrite: NEGATIVE
PH: 6 (ref 5.0–8.0)
RBC / HPF: NONE SEEN (ref 0–?)
Specific Gravity, Urine: >=1.03 — AB (ref 1.000–1.030)
Total Protein, Urine: NEGATIVE
Urine Glucose: NEGATIVE
Urobilinogen, UA: 0.2 (ref 0.0–1.0)
WBC, UA: NONE SEEN (ref 0–?)

## 2015-04-06 LAB — COMPREHENSIVE METABOLIC PANEL
ALT: 11 U/L (ref 0–35)
AST: 14 U/L (ref 0–37)
Albumin: 4.2 g/dL (ref 3.5–5.2)
Alkaline Phosphatase: 42 U/L (ref 39–117)
BILIRUBIN TOTAL: 0.5 mg/dL (ref 0.2–1.2)
BUN: 9 mg/dL (ref 6–23)
CALCIUM: 9.2 mg/dL (ref 8.4–10.5)
CHLORIDE: 105 meq/L (ref 96–112)
CO2: 27 mEq/L (ref 19–32)
Creatinine, Ser: 0.89 mg/dL (ref 0.40–1.20)
GFR: 93.55 mL/min (ref >60.00)
Glucose, Bld: 93 mg/dL (ref 70–99)
POTASSIUM: 3.6 meq/L (ref 3.5–5.1)
SODIUM: 139 meq/L (ref 135–145)
TOTAL PROTEIN: 7.4 g/dL (ref 6.0–8.3)

## 2015-04-06 LAB — FERRITIN: FERRITIN: 4.2 ng/mL — AB (ref 10.0–291.0)

## 2015-04-06 LAB — AMYLASE: AMYLASE: 48 U/L (ref 27–131)

## 2015-04-06 LAB — IBC PANEL
Iron: 103 ug/dL (ref 42–145)
Saturation Ratios: 25.5 % (ref 20.0–50.0)
Transferrin: 288 mg/dL (ref 212.0–360.0)

## 2015-04-06 LAB — TSH: TSH: 1.05 u[IU]/mL (ref 0.35–4.50)

## 2015-04-06 LAB — HCG, QUANTITATIVE, PREGNANCY: Quantitative HCG: 0 m[IU]/mL

## 2015-04-06 LAB — LIPASE: Lipase: 15 U/L (ref 11.0–59.0)

## 2015-04-06 MED ORDER — LINACLOTIDE 145 MCG PO CAPS: 145.0000 ug | ORAL_CAPSULE | Freq: Every day | ORAL | Status: DC

## 2015-04-06 NOTE — Progress Notes (Signed)
Subjective:  [name], female    DOB: October 01, 1980  Age: [age] y.o. MRN: [id number]  CC: Flank Pain and Constipation   HPI Lisa Singh presents for two-month history of left flank pain. She states the pain sometimes radiates to her back and becomes sharp, there is a pain in the left flank that is dull, sometimes there is a pain in the left upper quadrant that is sharp. She also reports mild dysuria.  No outpatient prescriptions prior to visit.   No facility-administered medications prior to visit.    ROS Review of Systems  Constitutional: Negative.  Negative for fever, chills, diaphoresis, appetite change and fatigue.  HENT: Negative.   Eyes: Negative.   Respiratory: Negative.  Negative for cough, choking and shortness of breath.   Cardiovascular: Negative.  Negative for leg swelling.  Gastrointestinal: Positive for abdominal pain and constipation. Negative for nausea, vomiting, diarrhea, blood in stool, abdominal distention, anal bleeding and rectal pain.  Endocrine: Negative.   Genitourinary: Positive for dysuria and flank pain. Negative for urgency, frequency, hematuria, decreased urine volume, vaginal bleeding, vaginal discharge, difficulty urinating, vaginal pain and pelvic pain.  Musculoskeletal: Negative.  Negative for back pain and neck pain.  Skin: Negative.   Allergic/Immunologic: Negative.   Neurological: Negative.   Hematological: Negative.  Negative for adenopathy. Does not bruise/bleed easily.  Psychiatric/Behavioral: Negative.     Objective:  BP 120/80 mmHg  Pulse 86  Temp(Src) 98.5 F (36.9 C) (Oral)  Resp 16  Ht 5\' 2"  (1.575 m)  Wt 139 lb (63.05 kg)  BMI 25.42 kg/m2  SpO2 99%  LMP 04/05/2015  BP Readings from Last 3 Encounters:  04/06/15 120/80  03/11/15 110/78  01/09/15 106/74    Wt Readings from Last 3 Encounters:  04/06/15 139 lb (63.05 kg)  03/11/15 136 lb (61.689 kg)  06/24/12 196 lb 11.2 oz (89.223 kg)    Physical Exam    Constitutional: She is oriented to person, place, and time.  Non-toxic appearance. She does not have a sickly appearance. She does not appear ill. No distress.  HENT:  Mouth/Throat: Oropharynx is clear and moist. No oropharyngeal exudate.  Eyes: Conjunctivae are normal. Right eye exhibits no discharge. Left eye exhibits no discharge. No scleral icterus.  Neck: Normal range of motion. Neck supple. No JVD present. No tracheal deviation present. No thyromegaly present.  Cardiovascular: Normal rate, regular rhythm, normal heart sounds and intact distal pulses.  Exam reveals no gallop and no friction rub.   No murmur heard. Pulmonary/Chest: Effort normal and breath sounds normal. No stridor. No respiratory distress. She has no wheezes. She has no rales. She exhibits no tenderness.  Abdominal: Soft. Normal appearance and bowel sounds are normal. She exhibits no distension and no mass. There is no hepatosplenomegaly, splenomegaly or hepatomegaly. There is no tenderness. There is no rigidity, no rebound, no guarding, no CVA tenderness, no tenderness at McBurney's point and negative Murphy's sign.  Musculoskeletal: Normal range of motion. She exhibits no edema or tenderness.  Lymphadenopathy:    She has no cervical adenopathy.  Neurological: She is oriented to person, place, and time.  Skin: Skin is warm and dry. No rash noted. She is not diaphoretic. No erythema. No pallor.  Psychiatric: She has a normal mood and affect. Her behavior is normal. Judgment and thought content normal.  Vitals reviewed.   Lab Results  Component Value Date   WBC 3.4* 04/06/2015   HGB 11.3* 04/06/2015   HCT 35.7* 04/06/2015  PLT 291.0 04/06/2015   GLUCOSE 93 04/06/2015   ALT 11 04/06/2015   AST 14 04/06/2015   NA 139 04/06/2015   K 3.6 04/06/2015   CL 105 04/06/2015   CREATININE 0.89 04/06/2015   BUN 9 04/06/2015   CO2 27 04/06/2015   TSH 1.05 04/06/2015    No results found.  Assessment & Plan:   Falicity  was seen today for flank pain and constipation.  Diagnoses and all orders for this visit:  Left flank pain, chronic- her exam is normal, labs show an insignificant amount of blood un the urine so I don't think she has a kidney stone. The plain films show a moderate level of stool burden - I am concerned this may be causing her discomfort and have asked her to start Linzess to treat the constipation. Orders: -     Lipase; Future -     Comprehensive metabolic panel; Future -     CBC with Differential/Platelet; Future -     CULTURE, URINE COMPREHENSIVE; Future -     Urinalysis, Routine w reflex microscopic (not at [facility]); Future -     hCG, quantitative, pregnancy; Future -     Amylase; Future -     DG Abd Acute W/Chest; Future  Constipation, unspecified constipation type- her labs do not show any secondary causes for this. It may be related to her iron therapy. We'll start Linz ess for symptomatic relief. Orders: -     TSH; Future -     DG Abd Acute W/Chest; Future -     Linaclotide (LINZESS) 145 MCG CAPS capsule; Take 1 capsule (145 mcg total) by mouth daily.  Iron deficiency anemia due to chronic blood loss- improvement noted, she will continue iron replacement therapy. Orders: -     CBC with Differential/Platelet; Future -     IBC panel; Future -     Ferritin; Future   I am having Ms. Singh start on Linaclotide.  Meds ordered this encounter  Medications  . Linaclotide (LINZESS) 145 MCG CAPS capsule    Sig: Take 1 capsule (145 mcg total) by mouth daily.    Dispense:  90 capsule    Refill:  3     Follow-up: Return in about 1 week (around 04/13/2015).  Sanda Linger, MD

## 2015-04-06 NOTE — Progress Notes (Signed)
Pre visit review using our clinic review tool, if applicable. No additional management support is needed unless otherwise documented below in the visit note. 

## 2015-04-06 NOTE — Patient Instructions (Signed)
Flank Pain °Flank pain refers to pain that is located on the side of the body between the upper abdomen and the back. The pain may occur over a short period of time (acute) or may be long-term or reoccurring (chronic). It may be mild or severe. Flank pain can be caused by many things. °CAUSES  °Some of the more common causes of flank pain include: °· Muscle strains.   °· Muscle spasms.   °· A disease of your spine (vertebral disk disease).   °· A lung infection (pneumonia).   °· Fluid around your lungs (pulmonary edema).   °· A kidney infection.   °· Kidney stones.   °· A very painful skin rash caused by the chickenpox virus (shingles).   °· Gallbladder disease.   °HOME CARE INSTRUCTIONS  °Home care will depend on the cause of your pain. In general, °· Rest as directed by your caregiver. °· Drink enough fluids to keep your urine clear or pale yellow. °· Only take over-the-counter or prescription medicines as directed by your caregiver. Some medicines may help relieve the pain. °· Tell your caregiver about any changes in your pain. °· Follow up with your caregiver as directed. °SEEK IMMEDIATE MEDICAL CARE IF:  °· Your pain is not controlled with medicine.   °· You have new or worsening symptoms. °· Your pain increases.   °· You have abdominal pain.   °· You have shortness of breath.   °· You have persistent nausea or vomiting.   °· You have swelling in your abdomen.   °· You feel faint or pass out.   °· You have blood in your urine. °· You have a fever or persistent symptoms for more than 2-3 days. °· You have a fever and your symptoms suddenly get worse. °MAKE SURE YOU:  °· Understand these instructions. °· Will watch your condition. °· Will get help right away if you are not doing well or get worse. °Document Released: 10/26/2005 Document Revised: 05/29/2012 Document Reviewed: 04/18/2012 °ExitCare® Patient Information ©2015 [employer]. This information is not intended to replace advice given to you by your  health care provider. Make sure you discuss any questions you have with your health care provider. ° °

## 2015-04-08 ENCOUNTER — Encounter: Payer: Self-pay | Admitting: Internal Medicine

## 2015-04-08 ENCOUNTER — Telehealth: Payer: Self-pay | Admitting: Family

## 2015-04-08 LAB — CULTURE, URINE COMPREHENSIVE
COLONY COUNT: NO GROWTH
ORGANISM ID, BACTERIA: NO GROWTH

## 2015-04-08 NOTE — Telephone Encounter (Signed)
Pt called in and would like for a nurse to give her a call with her lab result   Best number is [phone]

## 2015-04-08 NOTE — Telephone Encounter (Signed)
x-ray showsa moderate amount of stool burden in her abdomen. This may be causing her discomfort, MD sent a prescription medication to her pharmacy to treat constipation. Patient notified, will start Rx and call back on Monday if no difference.

## 2015-04-09 ENCOUNTER — Telehealth: Payer: Self-pay | Admitting: Internal Medicine

## 2015-04-09 NOTE — Telephone Encounter (Signed)
[facility] - Client TELEPHONE ADVICE RECORD   [facility]     Patient Name: Lisa Singh Client [facility] - Client    Client Site [facility]    Physician Sanda Linger     Contact Type Call  Gender: Female Call Type Triage / Clinical  DOB: 1981-01-06  Relationship To Patient Self  Age: 34 Y 8 M 8 D Return Phone Number [phone] (Primary)  Return Phone Number: [phone] (Primary) Chief Complaint Stool - Unusual Color  Address:  Initial Comment Caller states she is constipated. She had black seed-like things in her stool.   City/State/Zip: [place]  PreDisposition Did not know what to do    Nurse Assessment  Nurse: Logan Bores, RN, Melissa Date/Time (Eastern Time): 04/09/2015 4:49:11 PM  Confirm and document reason for call. If symptomatic, describe symptoms. ---Caller states she is constipated. She had black seed-like things in her stool.   Has the patient traveled out of the country within the last 30 days? ---Not Applicable   Does the patient require triage? ---Yes   Related visit to physician within the last 2 weeks? ---Yes   Does the PT have any chronic conditions? (i.e. diabetes, asthma, etc.) ---Yes   List chronic conditions. ---constipation   Did the patient indicate they were pregnant? ---No     Guidelines      Guideline Title Affirmed Question Affirmed Notes Nurse Date/Time (Eastern Time)  Stools - Unusual Color [1] Abnormal color is unexplained AND [2] persists > 24 hours  Evans, RN, Melissa 04/09/2015 4:51:13 PM  Disp. Time Lamount Cohen Time) Disposition Final User         04/09/2015 4:59:57 PM See PCP When Office is Open (within 3 days) Yes Logan Bores, RN, Efraim Kaufmann         Caller Understands: Yes   Disagree/Comply: Comply      Care Advice Given Per Guideline         SEE PCP WITHIN 3 DAYS: You need to be examined within 2 or 3 days. Call your doctor during regular office hours and make an appointment.  (Note: if office will be open tomorrow, tell caller to call then, not in 3 days). TREATMENT: No treatment is needed at this time. CALL BACK IF: * You become worse.   After Care Instructions Given     Call Event Type User Date / Time Description                          [facility] - Client TELEPHONE ADVICE RECORD   [facility]     Patient Name: Lisa Singh Client [facility] - Client    Client Site [facility]    Physician Sanda Linger     Contact Type Call  Gender: Female Call Type Triage / Clinical  DOB: [birth year]  Relationship To Patient Self  Age: 24 Y 8 M 8 D Return Phone Number [phone] (Primary)  Return Phone Number: [phone] (Primary) Chief Complaint Stool - Unusual Color  Address:  Initial Comment Caller states she is constipated. She had black seed-like things in her stool.   City/State/Zip: [place]  PreDisposition Did not know what to do    Nurse Assessment  Nurse: Logan Bores, RN, Melissa Date/Time (Eastern Time): 04/09/2015 4:49:11 PM  Confirm and document reason for call. If symptomatic, describe  symptoms. ---Caller states she is constipated. She had black seed-like things in her stool.   Has the patient traveled out of the country within the last 30 days? ---Not Applicable   Does the patient require triage? ---Yes   Related visit to physician within the last 2 weeks? ---Yes   Does the PT have any chronic conditions? (i.e. diabetes, asthma, etc.) ---Yes   List chronic conditions. ---constipation   Did the patient indicate they were pregnant? ---No     Guidelines      Guideline Title Affirmed Question Affirmed Notes Nurse Date/Time (Eastern Time)  Stools - Unusual Color [1] Abnormal color is unexplained AND [2] persists > 24 hours  Evans, RN, Melissa 04/09/2015 4:51:13 PM  Disp. Time Lamount Cohen Time) Disposition Final User         04/09/2015 4:59:57 PM See PCP When Office is Open  (within 3 days) Yes Logan Bores, RN, Efraim Kaufmann         Caller Understands: Yes   Disagree/Comply: Comply      Care Advice Given Per Guideline         SEE PCP WITHIN 3 DAYS: You need to be examined within 2 or 3 days. Call your doctor during regular office hours and make an appointment. (Note: if office will be open tomorrow, tell caller to call then, not in 3 days). TREATMENT: No treatment is needed at this time. CALL BACK IF: * You become worse.   After Care Instructions Given     Call Event Type User Date / Time Description

## 2015-04-09 NOTE — Telephone Encounter (Signed)
Patient called back and she hasn't started her medication yet, but her pain in her side has gone down a little and she is seeing what looks like little black seeds in her stool. She was concerned about this. I transferred her over to the Nurse Line.

## 2015-04-12 ENCOUNTER — Ambulatory Visit (INDEPENDENT_AMBULATORY_CARE_PROVIDER_SITE_OTHER): Payer: Managed Care, Other (non HMO) | Admitting: Family

## 2015-04-12 ENCOUNTER — Other Ambulatory Visit: Payer: Managed Care, Other (non HMO)

## 2015-04-12 ENCOUNTER — Encounter: Payer: Self-pay | Admitting: Family

## 2015-04-12 VITALS — BP 120/78 | HR 82 | Temp 98.0°F | Resp 18 | Ht 62.0 in | Wt 140.0 lb

## 2015-04-12 DIAGNOSIS — R195 Other fecal abnormalities: Secondary | ICD-10-CM

## 2015-04-12 NOTE — Progress Notes (Signed)
Subjective:    [name], female    DOB: 1981-01-27, 34 y.o.   MRN: [id number]  Chief Complaint  Patient presents with  . Stool Color Change    states that before she even started the linzess she passed stool that had seeds in it, and then passed one with mucous in it, states that she was on a parasite cleanse and doesn't know if that is where this is coming from    HPI:  Lisa Singh is a 34 y.o. female with a PMH of seasonal allergies, anemia, and constipation who presents today for a follow up office visit.   Previously seen in the office for left flank pain and was diagnosed with constipation and iron deficiency anemia secondary to chronic blood loss. She was started on Linzess to help with the constipation.   Notes that before starting the linzess she had a bowel movement that had black seeds in it that seeds were separate from the stool enwraped in a small amount of mucus. A second bowel movement was mainly mucus which has followed. and one with mucus in it. She has not seen any seeds since the initial bowel movement. May have a hemorrhoid.  She was working on a parasite cleanse for which she completed half of it about 2 weeks ago.  Continues to experience the associated symptoms of constipation and mild abdominal pain located in her lower abdomen which is occasionally described as crampy. Modifying factors for her constipation include psyllium and prune juice. Denies fevers, chills or melena.   Allergies  Allergen Reactions  . Penicillins Hives    Current Outpatient Prescriptions on File Prior to Visit  Medication Sig Dispense Refill  . Linaclotide (LINZESS) 145 MCG CAPS capsule Take 1 capsule (145 mcg total) by mouth daily. 90 capsule 3   No current facility-administered medications on file prior to visit.   Past Medical History  Diagnosis Date  . No pertinent past medical history   . Allergy   . UTI (lower urinary tract infection)   . Anemia     Iron  deficiency    Review of Systems  Constitutional: Negative for fever and chills.  Gastrointestinal: Positive for abdominal pain, constipation and abdominal distention. Negative for nausea, vomiting, diarrhea, anal bleeding and rectal pain.      Objective:    BP 120/78 mmHg  Pulse 82  Temp(Src) 98 F (36.7 C) (Oral)  Resp 18  Ht 5\' 2"  (1.575 m)  Wt 140 lb (63.504 kg)  BMI 25.60 kg/m2  SpO2 98%  LMP 04/05/2015 Nursing note and vital signs reviewed.  Physical Exam  Constitutional: She is oriented to person, place, and time. She appears well-developed and well-nourished. No distress.  Cardiovascular: Normal rate, regular rhythm, normal heart sounds and intact distal pulses.   Pulmonary/Chest: Effort normal and breath sounds normal.  Abdominal: Soft. Normal appearance and bowel sounds are normal. She exhibits no fluid wave, no ascites and no mass. There is no hepatosplenomegaly. There is generalized tenderness. There is no rigidity, no rebound, no guarding, no CVA tenderness, no tenderness at McBurney's point and negative Murphy's sign.  Neurological: She is alert and oriented to person, place, and time.  Skin: Skin is warm and dry.  Psychiatric: She has a normal mood and affect. Her behavior is normal. Judgment and thought content normal.       Assessment & Plan:   Problem List Items Addressed This Visit      Other   Abnormal  stools - Primary    Stool sample provided in office was tan with brown spots and appears mucusy. Obtain hemoccult, ova and parasite evaluation and stool culture. Continue linzess and OTC medications as needed for constipation pending stool results. If symptoms worsen seek further care. Question IBS, IBD or diverticulitis. Follow up with GI pending stool results.       Relevant Orders   Stool Culture   Ova and parasite examination   Hemoccult Cards (X3 cards)

## 2015-04-12 NOTE — Progress Notes (Signed)
Pre visit review using our clinic review tool, if applicable. No additional management support is needed unless otherwise documented below in the visit note. 

## 2015-04-12 NOTE — Telephone Encounter (Signed)
Pt is on the schedule to be see today.

## 2015-04-12 NOTE — Patient Instructions (Signed)
Thank you for choosing [facility].  Summary/Instructions:  Please continue to take the Linzess  If your symptoms worsen or fail to improve, please contact our office for further instruction, or in case of emergency go directly to the emergency room at the closest medical facility.   Constipation Constipation is when a person has fewer than three bowel movements a week, has difficulty having a bowel movement, or has stools that are dry, hard, or larger than normal. As people grow older, constipation is more common. If you try to fix constipation with medicines that make you have a bowel movement (laxatives), the problem may get worse. Long-term laxative use may cause the muscles of the colon to become weak. A low-fiber diet, not taking in enough fluids, and taking certain medicines may make constipation worse.  CAUSES   Certain medicines, such as antidepressants, pain medicine, iron supplements, antacids, and water pills.   Certain diseases, such as diabetes, irritable bowel syndrome (IBS), thyroid disease, or depression.   Not drinking enough water.   Not eating enough fiber-rich foods.   Stress or travel.   Lack of physical activity or exercise.   Ignoring the urge to have a bowel movement.   Using laxatives too much.  SIGNS AND SYMPTOMS   Having fewer than three bowel movements a week.   Straining to have a bowel movement.   Having stools that are hard, dry, or larger than normal.   Feeling full or bloated.   Pain in the lower abdomen.   Not feeling relief after having a bowel movement.  DIAGNOSIS  Your health care provider will take a medical history and perform a physical exam. Further testing may be done for severe constipation. Some tests may include:  A barium enema X-ray to examine your rectum, colon, and, sometimes, your small intestine.   A sigmoidoscopy to examine your lower colon.   A colonoscopy to examine your entire  colon. TREATMENT  Treatment will depend on the severity of your constipation and what is causing it. Some dietary treatments include drinking more fluids and eating more fiber-rich foods. Lifestyle treatments may include regular exercise. If these diet and lifestyle recommendations do not help, your health care provider may recommend taking over-the-counter laxative medicines to help you have bowel movements. Prescription medicines may be prescribed if over-the-counter medicines do not work.  HOME CARE INSTRUCTIONS   Eat foods that have a lot of fiber, such as fruits, vegetables, whole grains, and beans.  Limit foods high in fat and processed sugars, such as french fries, hamburgers, cookies, candies, and soda.   A fiber supplement may be added to your diet if you cannot get enough fiber from foods.   Drink enough fluids to keep your urine clear or pale yellow.   Exercise regularly or as directed by your health care provider.   Go to the restroom when you have the urge to go. Do not hold it.   Only take over-the-counter or prescription medicines as directed by your health care provider. Do not take other medicines for constipation without talking to your health care provider first.  SEEK IMMEDIATE MEDICAL CARE IF:   You have bright red blood in your stool.   Your constipation lasts for more than 4 days or gets worse.   You have abdominal or rectal pain.   You have thin, pencil-like stools.   You have unexplained weight loss. MAKE SURE YOU:   Understand these instructions.  Will watch your condition.  Will get  help right away if you are not doing well or get worse. Document Released: 06/02/2004 Document Revised: 09/09/2013 Document Reviewed: 06/16/2013 [facility] Patient Information 2015 [place], [place]. This information is not intended to replace advice given to you by your health care provider. Make sure you discuss any questions you have with your health care  provider.

## 2015-04-12 NOTE — Assessment & Plan Note (Signed)
Stool sample provided in office was tan with brown spots and appears mucusy. Obtain hemoccult, ova and parasite evaluation and stool culture. Continue linzess and OTC medications as needed for constipation pending stool results. If symptoms worsen seek further care. Question IBS, IBD or diverticulitis. Follow up with GI pending stool results.

## 2015-04-13 ENCOUNTER — Other Ambulatory Visit (INDEPENDENT_AMBULATORY_CARE_PROVIDER_SITE_OTHER): Payer: Managed Care, Other (non HMO)

## 2015-04-13 DIAGNOSIS — R195 Other fecal abnormalities: Secondary | ICD-10-CM | POA: Diagnosis not present

## 2015-04-13 LAB — HEMOCCULT SLIDES (X 3 CARDS)
Fecal Occult Blood: NEGATIVE
OCCULT 1: NEGATIVE
OCCULT 2: NEGATIVE
OCCULT 3: NEGATIVE
OCCULT 4: NEGATIVE
OCCULT 5: NEGATIVE

## 2015-04-13 LAB — OVA AND PARASITE EXAMINATION: OP: NONE SEEN

## 2015-04-14 ENCOUNTER — Telehealth: Payer: Self-pay | Admitting: Family

## 2015-04-14 LAB — OVA AND PARASITE EXAMINATION: OP: NONE SEEN

## 2015-04-14 NOTE — Telephone Encounter (Signed)
[name], FNP at 04/14/2015 2:06 PM     Status: Signed       Expand All Collapse All   Spoke with patient regarding negative results to date. Awaiting stool culture.        Duplicate message

## 2015-04-14 NOTE — Telephone Encounter (Signed)
Patient is calling for her lab results.  

## 2015-04-14 NOTE — Telephone Encounter (Signed)
Spoke with patient regarding negative results to date. Awaiting stool culture.

## 2015-04-16 LAB — STOOL CULTURE

## 2015-04-16 NOTE — Telephone Encounter (Signed)
Please inform patient that all of her results are negative and therefore there is no medication that is needed at this time. If she would like we can send her to consult with GI.

## 2015-04-16 NOTE — Telephone Encounter (Signed)
Pt aware of results 

## 2015-04-30 ENCOUNTER — Telehealth: Payer: Self-pay | Admitting: Family

## 2015-04-30 DIAGNOSIS — R195 Other fecal abnormalities: Secondary | ICD-10-CM

## 2015-04-30 NOTE — Telephone Encounter (Signed)
Referral placed.

## 2015-04-30 NOTE — Telephone Encounter (Signed)
Pt aware.

## 2015-04-30 NOTE — Telephone Encounter (Signed)
Pt called in and she is not completely better .  She has lost 4 pound and a lot of water.  She wants to know if she can get a referral to GI   Best number [phone]

## 2015-05-05 ENCOUNTER — Encounter: Payer: Self-pay | Admitting: Gastroenterology

## 2015-05-20 ENCOUNTER — Ambulatory Visit (INDEPENDENT_AMBULATORY_CARE_PROVIDER_SITE_OTHER): Payer: Managed Care, Other (non HMO) | Admitting: Gastroenterology

## 2015-05-20 ENCOUNTER — Other Ambulatory Visit (INDEPENDENT_AMBULATORY_CARE_PROVIDER_SITE_OTHER): Payer: Managed Care, Other (non HMO)

## 2015-05-20 ENCOUNTER — Encounter: Payer: Self-pay | Admitting: Gastroenterology

## 2015-05-20 VITALS — BP 114/70 | HR 66 | Ht 62.0 in | Wt 137.4 lb

## 2015-05-20 DIAGNOSIS — K5900 Constipation, unspecified: Secondary | ICD-10-CM | POA: Diagnosis not present

## 2015-05-20 DIAGNOSIS — R109 Unspecified abdominal pain: Secondary | ICD-10-CM | POA: Diagnosis not present

## 2015-05-20 LAB — IGA: IGA: 234 mg/dL (ref 68–378)

## 2015-05-20 NOTE — Patient Instructions (Addendum)
You have been scheduled for an abdominal ultrasound at [facility] (1st floor of hospital) on 05/25/2015 at 7:30am. Please arrive 15 minutes prior to your appointment for registration. Make certain not to have anything to eat or drink 6 hours prior to your appointment. Should you need to reschedule your appointment, please contact radiology at [phone]. This test typically takes about 30 minutes to perform.  Start Miralax daily  Go to the basement for labs today Your Follow up appointment with Dr Rhea Belton os scheduled on 07/16/2015 at 9:45am

## 2015-05-20 NOTE — Progress Notes (Addendum)
05/20/2015 Lisa Singh [id number] [birth year]   HISTORY OF PRESENT ILLNESS:  This is a 34 year old female who is new to our practice and was referred here by her PCP, Dr. Carver Fila, for evaluation of constipation and abdominal pain.  Complains of left flank pain that radiates into her lower back.  No really affected by eating or bowel movements.  She says that the constipation has been going on for a while, but is not a life-long issue.  She recently did an "herbal cleanse for parasites" and some type of colonic.  Reports seeing some stuff in her stool that looked like small seeds and some mucus.  No diarrhea, but stool studies were ordered by PCP and were negative for O&P and enteric pathogen.  Also negative for occult blood x 3.  She denies any urinary symptoms currently; recent urine studies ok as well.  Lipase/amylase, CMP, TSH normal.  CBC shows mildly low Hgb at 11.3 grams.  Iron studies were low as well and she was told to take iron supplements but she knows they can worsen her constipation so is not taking them.  Tried linzess for the constipation as well but no longer taking it.  Just of note, the patient was 15 minutes late for her appointment and then was preoccupied with her phone for most of the visit.    Past Medical History  Diagnosis Date  . No pertinent past medical history   . Allergy   . UTI (lower urinary tract infection)   . Anemia     Iron deficiency   Past Surgical History  Procedure Laterality Date  . Cesarean section      reports that she has never smoked. She has never used smokeless tobacco. She reports that she drinks alcohol. She reports that she does not use illicit drugs. family history includes Anemia in her maternal grandmother; Cancer in her paternal grandfather and paternal grandmother; Healthy in her mother; Hypertension in her father and maternal grandmother. There is no history of Anesthesia problems, Hypotension, Malignant hyperthermia, Pseudochol  deficiency, or Other. Allergies  Allergen Reactions  . Penicillins Hives      Outpatient Encounter Prescriptions as of 05/20/2015  Medication Sig  . Multiple Vitamin (MULTIVITAMIN) tablet Take 1 tablet by mouth daily.  . [DISCONTINUED] Linaclotide (LINZESS) 145 MCG CAPS capsule Take 1 capsule (145 mcg total) by mouth daily.   No facility-administered encounter medications on file as of 05/20/2015.     REVIEW OF SYSTEMS  : All other systems reviewed and negative except where noted in the History of Present Illness.   PHYSICAL EXAM: BP 114/70 mmHg  Pulse 66  Ht  (1.575 m)  Wt 137 lb 6.4 oz (62.324 kg)  BMI 25.12 kg/m2 General: Well developed female in no acute distress Head: Normocephalic and atraumatic Eyes:  Sclerae anicteric, conjunctiva pink. Ears: Normal auditory acuity Lungs: Clear throughout to auscultation Heart: Regular rate and rhythm Abdomen: Soft, non-distended.  Normal bowel sounds.  Slightly tender along left flank. Musculoskeletal: Symmetrical with no gross deformities  Skin: No lesions on visible extremities Extremities: No edema  Neurological: Alert oriented x 4, grossly non-focal Psychological:  Alert and cooperative. Normal mood and affect  ASSESSMENT AND PLAN: -Constipation:  Will begin Miralax daily. -Abdominal pain:  This is actually more flank/back pain.  ? If it is non-GI in origin.  No urinary symptoms.  Will start by checking abdominal ultrasound to evaluate kidneys as well.  Patient requesting celiac labs.  CC:  [name], FNP   Addendum: Reviewed and agree with initial management. Beverley Fiedler, MD

## 2015-05-25 ENCOUNTER — Ambulatory Visit (HOSPITAL_COMMUNITY)
Admission: RE | Admit: 2015-05-25 | Discharge: 2015-05-25 | Disposition: A | Discharge status: Home / Self Care | Payer: Managed Care, Other (non HMO) | Source: Ambulatory Visit | Attending: Gastroenterology | Admitting: Gastroenterology

## 2015-05-25 DIAGNOSIS — R109 Unspecified abdominal pain: Secondary | ICD-10-CM | POA: Insufficient documentation

## 2015-06-01 ENCOUNTER — Telehealth: Payer: Self-pay | Admitting: Gastroenterology

## 2015-06-01 DIAGNOSIS — R109 Unspecified abdominal pain: Secondary | ICD-10-CM

## 2015-06-01 NOTE — Telephone Encounter (Signed)
Requesting imaging results

## 2015-06-02 NOTE — Telephone Encounter (Signed)
Unable to leave a message because mailbox is full. Will try again later.

## 2015-06-02 NOTE — Telephone Encounter (Signed)
Please let patient know that ultrasound was normal.  Can you please check into why her celiac labs, TTG, is still listed as "needs to be collected"?   Thank you,  Jess

## 2015-06-02 NOTE — Telephone Encounter (Signed)
Checked with lab and the person that drew the lab is not here but it was not done. Do you want her to come back for lab to be drawn?

## 2015-06-02 NOTE — Telephone Encounter (Signed)
Yes, please.  At her convenience.  Thank you,  Jess

## 2015-06-03 NOTE — Telephone Encounter (Signed)
Patient notified of results and recommendation. She will try to get lab drawn.

## 2015-07-16 ENCOUNTER — Ambulatory Visit: Payer: Managed Care, Other (non HMO) | Admitting: Internal Medicine

## 2015-08-26 ENCOUNTER — Emergency Department (HOSPITAL_BASED_OUTPATIENT_CLINIC_OR_DEPARTMENT_OTHER): Payer: Managed Care, Other (non HMO)

## 2015-08-26 ENCOUNTER — Emergency Department (HOSPITAL_BASED_OUTPATIENT_CLINIC_OR_DEPARTMENT_OTHER)
Admission: EM | Admit: 2015-08-26 | Discharge: 2015-08-26 | Disposition: A | Discharge status: Home / Self Care | Payer: Managed Care, Other (non HMO) | Attending: Emergency Medicine | Admitting: Emergency Medicine

## 2015-08-26 ENCOUNTER — Encounter (HOSPITAL_BASED_OUTPATIENT_CLINIC_OR_DEPARTMENT_OTHER): Payer: Self-pay | Admitting: Emergency Medicine

## 2015-08-26 DIAGNOSIS — Z87440 Personal history of urinary (tract) infections: Secondary | ICD-10-CM | POA: Diagnosis not present

## 2015-08-26 DIAGNOSIS — R5383 Other fatigue: Secondary | ICD-10-CM | POA: Diagnosis not present

## 2015-08-26 DIAGNOSIS — Z880 Allergy status to penicillin: Secondary | ICD-10-CM | POA: Diagnosis not present

## 2015-08-26 DIAGNOSIS — R531 Weakness: Secondary | ICD-10-CM | POA: Insufficient documentation

## 2015-08-26 DIAGNOSIS — Z862 Personal history of diseases of the blood and blood-forming organs and certain disorders involving the immune mechanism: Secondary | ICD-10-CM | POA: Insufficient documentation

## 2015-08-26 DIAGNOSIS — R079 Chest pain, unspecified: Secondary | ICD-10-CM | POA: Diagnosis present

## 2015-08-26 DIAGNOSIS — R0789 Other chest pain: Secondary | ICD-10-CM | POA: Diagnosis not present

## 2015-08-26 LAB — CBC
HCT: 36.9 % (ref 36.0–46.0)
Hemoglobin: 11.7 g/dL — ABNORMAL LOW (ref 12.0–15.0)
MCH: 22.4 pg — AB (ref 26.0–34.0)
MCHC: 31.7 g/dL (ref 30.0–36.0)
MCV: 70.6 fL — AB (ref 78.0–100.0)
PLATELETS: 264 10*3/uL (ref 150–400)
RBC: 5.23 MIL/uL — AB (ref 3.87–5.11)
RDW: 14.5 % (ref 11.5–15.5)
WBC: 5.8 10*3/uL (ref 4.0–10.5)

## 2015-08-26 LAB — BASIC METABOLIC PANEL
Anion gap: 7 (ref 5–15)
BUN: 14 mg/dL (ref 6–20)
CALCIUM: 9.5 mg/dL (ref 8.9–10.3)
CHLORIDE: 104 mmol/L (ref 101–111)
CO2: 27 mmol/L (ref 22–32)
CREATININE: 0.87 mg/dL (ref 0.44–1.00)
GFR calc non Af Amer: >60 mL/min (ref >60)
GLUCOSE: 88 mg/dL (ref 65–99)
Potassium: 3.8 mmol/L (ref 3.5–5.1)
Sodium: 138 mmol/L (ref 135–145)

## 2015-08-26 LAB — TROPONIN I: Troponin I: <0.03 ng/mL (ref <0.031)

## 2015-08-26 MED ORDER — OMEPRAZOLE 20 MG PO CPDR: 20.0000 mg | DELAYED_RELEASE_CAPSULE | Freq: Every day | ORAL | Status: DC

## 2015-08-26 NOTE — ED Notes (Addendum)
Patient states that she has recurrent dull aches to her mid chest every other month. Today about an hour and half ago she had a sharp pain to her mid chest and it went away. The patient reports that she now has the same dull pain. The patient took 4 ASA prior to arrival. Patient reports that she did a lot of push ups today. The patient reports that she was [place] [employer] when she had the sharp pain and she was feeling slightly tired

## 2015-08-26 NOTE — ED Provider Notes (Signed)
CSN: [id number]     Arrival date & time 08/26/15  2112 History  By signing my name below, I, Lisa Singh, attest that this documentation has been prepared under the direction and in the presence of Shon Batonourtney F Yusuke Beza, MD. Electronically Signed: Soijett Singh, ED Scribe. 08/26/2015. 11:10 PM.   Chief Complaint  Patient presents with  . Chest Pain      The history is provided by the patient. No language interpreter was used.    HPI Comments: Lisa Singh is a 34 y.o. female who presents to the Emergency Department complaining of sharp mid chest pain onset today. She notes that she was shopping today and while walking she began to have a sharp pain that lasted 2 seconds and subsided. She denies there being any pain currently and that her CP is not worsened with anything. She notes that she has had dull CP intermittently for awhile. She states that she is having associated symptoms of lightheaded, weakness, and fatigue. She states that she has tried 4 ASA with no relief for her symptoms. She denies any other symptoms. Denies PMHx of blood clots, HTN, high cholesterol, recent long travel, surgeries, birth control use, and hospitalizations. She notes that she is a social drinker and that she doesn't smoke. She reports that she is allergic to penicillins.    Past Medical History  Diagnosis Date  . No pertinent past medical history   . Allergy   . UTI (lower urinary tract infection)   . Anemia     Iron deficiency   Past Surgical History  Procedure Laterality Date  . Cesarean section     Family History  Problem Relation Age of Onset  . Anesthesia problems Neg Hx   . Hypotension Neg Hx   . Malignant hyperthermia Neg Hx   . Pseudochol deficiency Neg Hx   . Other Neg Hx   . Healthy Mother   . Hypertension Father   . Hypertension Maternal Grandmother   . Anemia Maternal Grandmother   . Cancer Paternal Grandmother   . Cancer Paternal Grandfather    Social History  Substance Use Topics   . Smoking status: Never Smoker   . Smokeless tobacco: Never Used  . Alcohol Use: Yes     Comment: socially   OB History    Gravida Para Term Preterm AB TAB SAB Ectopic Multiple Living   3 2 1 1 1 1  0 0 0 2     Review of Systems  Constitutional: Positive for fatigue.  Respiratory: Negative for shortness of breath.   Cardiovascular: Positive for chest pain. Negative for leg swelling.  Gastrointestinal: Negative for nausea, vomiting and abdominal pain.  Neurological: Positive for weakness.  All other systems reviewed and are negative.     Allergies  Penicillins  Home Medications   Prior to Admission medications   Medication Sig Start Date End Date Taking? Authorizing Provider  Multiple Vitamin (MULTIVITAMIN) tablet Take 1 tablet by mouth daily.    Historical Provider, MD  omeprazole (PRILOSEC) 20 MG capsule Take 1 capsule (20 mg total) by mouth daily. 08/26/15   Shon Batonourtney F Akai Dollard, MD   BP 129/76 mmHg  Pulse 64  Temp(Src) 98.4 F (36.9 C) (Oral)  Resp 16  Ht 5\' 2"  (1.575 m)  Wt 140 lb (63.504 kg)  BMI 25.60 kg/m2  SpO2 99%  LMP 07/27/2015 (Approximate) Physical Exam  Constitutional: She is oriented to person, place, and time. She appears well-developed and well-nourished. No distress.  HENT:  Head:  Normocephalic and atraumatic.  Cardiovascular: Normal rate, regular rhythm and normal heart sounds.   No murmur heard. Pulmonary/Chest: Effort normal and breath sounds normal. No respiratory distress. She has no wheezes. She exhibits no tenderness.  Abdominal: Soft. Bowel sounds are normal. There is no tenderness. There is no rebound.  Musculoskeletal: She exhibits no edema.  Neurological: She is alert and oriented to person, place, and time.  Skin: Skin is warm and dry.  Psychiatric: She has a normal mood and affect.  Nursing note and vitals reviewed.   ED Course  Procedures (including critical care time) DIAGNOSTIC STUDIES: Oxygen Saturation is 100% on RA, nl by my  interpretation.    COORDINATION OF CARE: 11:07 PM Discussed treatment plan with pt at bedside which includes EKG, CXR, and labs and pt agreed to plan.    Labs Review Labs Reviewed  CBC - Abnormal; Notable for the following:    RBC 5.23 (*)    Hemoglobin 11.7 (*)    MCV 70.6 (*)    MCH 22.4 (*)    All other components within normal limits  BASIC METABOLIC PANEL  TROPONIN I    Imaging Review Dg Chest 2 View  08/26/2015  CLINICAL DATA:  Chronic intermittent dull mid chest ache. Initial encounter. EXAM: CHEST  2 VIEW COMPARISON:  Chest radiograph performed 04/06/2015 FINDINGS: The lungs are well-aerated and clear. There is no evidence of focal opacification, pleural effusion or pneumothorax. The heart is normal in size; the mediastinal contour is within normal limits. No acute osseous abnormalities are seen. IMPRESSION: No acute cardiopulmonary process seen. Electronically Signed   By: Roanna Raider M.D.   On: 08/26/2015 22:09   I have personally reviewed and evaluated these images and lab results as part of my medical decision-making.   EKG Interpretation ED ECG REPORT   Date: 08/27/2015  Rate: 70   Rhythm: normal sinus rhythm  QRS Axis: normal  Intervals: normal  ST/T Wave abnormalities: normal  Conduction Disutrbances:none  Narrative Interpretation:   Old EKG Reviewed: none available  I have personally reviewed the EKG tracing and agree with the computerized printout as noted.       MDM   Final diagnoses:  Other chest pain    Modena Jansky was several seconds of sharp chest pain prior to arrival. No association with exertion. No clear association with food. She is currently pain-free. Low risk for ACS. EKG is normal and troponin is negative. Chest x-ray shows no evidence of pneumothorax or pneumonia. She is PERC negative.  Patient reassured. Could be related to mild reflux and patient was given a prescription for omeprazole. Follow-up with primary physician.  After history,  exam, and medical workup I feel the patient has been appropriately medically screened and is safe for discharge home. Pertinent diagnoses were discussed with the patient. Patient was given return precautions.  I personally performed the services described in this documentation, which was scribed in my presence. The recorded information has been reviewed and is accurate.    Shon Baton, MD 08/27/15 [phone]

## 2015-08-26 NOTE — ED Notes (Signed)
Patient transported to X-ray 

## 2015-08-26 NOTE — Discharge Instructions (Signed)
You were seen today for chest pain. Your workup is reassuring. This may be related to mild reflux. You will be given an acid reducer.  Follow-up with your primary doctor.  Nonspecific Chest Pain  Chest pain can be caused by many different conditions. There is always a chance that your pain could be related to something serious, such as a heart attack or a blood clot in your lungs. Chest pain can also be caused by conditions that are not life-threatening. If you have chest pain, it is very important to follow up with your health care provider. CAUSES  Chest pain can be caused by:  Heartburn.  Pneumonia or bronchitis.  Anxiety or stress.  Inflammation around your heart (pericarditis) or lung (pleuritis or pleurisy).  A blood clot in your lung.  A collapsed lung (pneumothorax). It can develop suddenly on its own (spontaneous pneumothorax) or from trauma to the chest.  Shingles infection (varicella-zoster virus).  Heart attack.  Damage to the bones, muscles, and cartilage that make up your chest wall. This can include:  Bruised bones due to injury.  Strained muscles or cartilage due to frequent or repeated coughing or overwork.  Fracture to one or more ribs.  Sore cartilage due to inflammation (costochondritis). RISK FACTORS  Risk factors for chest pain may include:  Activities that increase your risk for trauma or injury to your chest.  Respiratory infections or conditions that cause frequent coughing.  Medical conditions or overeating that can cause heartburn.  Heart disease or family history of heart disease.  Conditions or health behaviors that increase your risk of developing a blood clot.  Having had chicken pox (varicella zoster). SIGNS AND SYMPTOMS Chest pain can feel like:  Burning or tingling on the surface of your chest or deep in your chest.  Crushing, pressure, aching, or squeezing pain.  Dull or sharp pain that is worse when you move, cough, or take a  deep breath.  Pain that is also felt in your back, neck, shoulder, or arm, or pain that spreads to any of these areas. Your chest pain may come and go, or it may stay constant. DIAGNOSIS Lab tests or other studies may be needed to find the cause of your pain. Your health care provider may have you take a test called an ambulatory ECG (electrocardiogram). An ECG records your heartbeat patterns at the time the test is performed. You may also have other tests, such as:  Transthoracic echocardiogram (TTE). During echocardiography, sound waves are used to create a picture of all of the heart structures and to look at how blood flows through your heart.  Transesophageal echocardiogram (TEE).This is a more advanced imaging test that obtains images from inside your body. It allows your health care provider to see your heart in finer detail.  Cardiac monitoring. This allows your health care provider to monitor your heart rate and rhythm in real time.  Holter monitor. This is a portable device that records your heartbeat and can help to diagnose abnormal heartbeats. It allows your health care provider to track your heart activity for several days, if needed.  Stress tests. These can be done through exercise or by taking medicine that makes your heart beat more quickly.  Blood tests.  Imaging tests. TREATMENT  Your treatment depends on what is causing your chest pain. Treatment may include:  Medicines. These may include:  Acid blockers for heartburn.  Anti-inflammatory medicine.  Pain medicine for inflammatory conditions.  Antibiotic medicine, if an infection is  present.  Medicines to dissolve blood clots.  Medicines to treat coronary artery disease.  Supportive care for conditions that do not require medicines. This may include:  Resting.  Applying heat or cold packs to injured areas.  Limiting activities until pain decreases. HOME CARE INSTRUCTIONS  If you were prescribed an  antibiotic medicine, finish it all even if you start to feel better.  Avoid any activities that bring on chest pain.  Do not use any tobacco products, including cigarettes, chewing tobacco, or electronic cigarettes. If you need help quitting, ask your health care provider.  Do not drink alcohol.  Take medicines only as directed by your health care provider.  Keep all follow-up visits as directed by your health care provider. This is important. This includes any further testing if your chest pain does not go away.  If heartburn is the cause for your chest pain, you may be told to keep your head raised (elevated) while sleeping. This reduces the chance that acid will go from your stomach into your esophagus.  Make lifestyle changes as directed by your health care provider. These may include:  Getting regular exercise. Ask your health care provider to suggest some activities that are safe for you.  Eating a heart-healthy diet. A registered dietitian can help you to learn healthy eating options.  Maintaining a healthy weight.  Managing diabetes, if necessary.  Reducing stress. SEEK MEDICAL CARE IF:  Your chest pain does not go away after treatment.  You have a rash with blisters on your chest.  You have a fever. SEEK IMMEDIATE MEDICAL CARE IF:   Your chest pain is worse.  You have an increasing cough, or you cough up blood.  You have severe abdominal pain.  You have severe weakness.  You faint.  You have chills.  You have sudden, unexplained chest discomfort.  You have sudden, unexplained discomfort in your arms, back, neck, or jaw.  You have shortness of breath at any time.  You suddenly start to sweat, or your skin gets clammy.  You feel nauseous or you vomit.  You suddenly feel light-headed or dizzy.  Your heart begins to beat quickly, or it feels like it is skipping beats. These symptoms may represent a serious problem that is an emergency. Do not wait to  see if the symptoms will go away. Get medical help right away. Call your local emergency services (911 in the [place] Do not drive yourself to the hospital.   This information is not intended to replace advice given to you by your health care provider. Make sure you discuss any questions you have with your health care provider.   Document Released: 06/14/2005 Document Revised: 09/25/2014 Document Reviewed: 04/10/2014 Elsevier Interactive Patient Education [employer]

## 2015-09-20 LAB — HM PAP SMEAR: HM Pap smear: NORMAL

## 2015-12-13 ENCOUNTER — Ambulatory Visit: Payer: Managed Care, Other (non HMO) | Admitting: Family

## 2016-04-04 ENCOUNTER — Ambulatory Visit (INDEPENDENT_AMBULATORY_CARE_PROVIDER_SITE_OTHER): Payer: Managed Care, Other (non HMO) | Admitting: Family

## 2016-04-04 ENCOUNTER — Encounter: Payer: Self-pay | Admitting: Family

## 2016-04-04 VITALS — BP 130/78 | HR 84 | Temp 98.7°F | Ht 62.0 in | Wt 153.0 lb

## 2016-04-04 DIAGNOSIS — Z0000 Encounter for general adult medical examination without abnormal findings: Secondary | ICD-10-CM

## 2016-04-04 DIAGNOSIS — R6889 Other general symptoms and signs: Secondary | ICD-10-CM | POA: Diagnosis not present

## 2016-04-04 DIAGNOSIS — Z0001 Encounter for general adult medical examination with abnormal findings: Secondary | ICD-10-CM | POA: Insufficient documentation

## 2016-04-04 DIAGNOSIS — L989 Disorder of the skin and subcutaneous tissue, unspecified: Secondary | ICD-10-CM | POA: Insufficient documentation

## 2016-04-04 NOTE — Progress Notes (Signed)
Pre visit review using our clinic review tool, if applicable. No additional management support is needed unless otherwise documented below in the visit note. 

## 2016-04-04 NOTE — Assessment & Plan Note (Signed)
Benign lesion in skin most likely seborrheic keratosis was cracked surgically removed without complication. Post care and return instructions provided. Follow if as needed.

## 2016-04-04 NOTE — Assessment & Plan Note (Signed)
1) Anticipatory Guidance: Discussed importance of wearing a seatbelt while driving and not texting while driving; changing batteries in smoke detector at least once annually; wearing suntan lotion when outside; eating a balanced and moderate diet; getting physical activity at least 30 minutes per day.  2) Immunizations / Screenings / Labs:  Declines tetanus. All other immunizations are up-to-date per recommendations. Due for a dental screen encouraged to be completed independently. Obtain vitamin D for vitamin D deficiency screening. All other screenings are up-to-date per recommendations. Obtain CBC, CMET, and Lipid profile.  Overall well exam with risk factors for cardiovascular disease being minimal at this time. Her BMI is 27.98 indicating overweight. Recommend weight loss of 5-10% of current body weight through nutrition and physical activity. Specifically altering nutrient intake focused on nutrient dense foods and decreasing processed/sugary foods. Continue other healthy lifestyle behaviors and choices. Follow-up prevention exam in 1 year. Follow-up office visit pending blood work as indicated.

## 2016-04-04 NOTE — Patient Instructions (Signed)
Thank you for choosing [facility].  Summary/Instructions:  Please stop by the lab on the lower level of the building for your blood work. Your results will be released to [place] (or called to you) after review, usually within 72 hours after test completion. If any changes need to be made, you will be notified at that same time.  1. The lab is open from 7:30am to 5:30 pm Monday-Friday  2. No appointment is necessary  3. Fasting (if needed) is 6-8 hours after food and drink; black coffee and water  are okay   If your symptoms worsen or fail to improve, please contact our office for further instruction, or in case of emergency go directly to the emergency room at the closest medical facility.    Cryosurgery for Skin Conditions, Care After Refer to this sheet in the next few weeks. These instructions provide you with information on caring for yourself after your procedure. Your health care provider may also give you more specific instructions. Your treatment has been planned according to current medical practices, but problems sometimes occur. Call your health care provider if you have any problems or questions after your procedure. WHAT TO EXPECT AFTER THE PROCEDURE After your procedure, it is typical to have the following:  The treated area will become red and swollen shortly after the procedure.  Within 2-3 days, a blister will form over the treated area. The blister may contain a small amount of blood.  In about 2 weeks, the blister will break on its own, leaving a scab. The treated area will then heal. After healing, there is usually little or no scarring. HOME CARE INSTRUCTIONS   Keep the treated area clean, dry, and covered with a bandage until healed. The area can be cleaned as usual with soap and water.  You may take showers. If your bandage gets wet, change it right away.  Do not pick at your blister or try to break it open. This can cause infection and scarring.  Do not  apply any medicine, cream, or lotion to the treated area unless directed to do so by your health care provider. SEEK MEDICAL CARE IF:   You have increased pain, swelling, redness, fluid drainage, or bleeding in the treated area.  Your blister becomes large and painful.   This information is not intended to replace advice given to you by your health care provider. Make sure you discuss any questions you have with your health care provider.   Document Released: 03/24/2005 Document Revised: 05/07/2013 Document Reviewed: 04/04/2013 Elsevier Interactive Patient Education 2016 [place] Maintenance, Female Adopting a healthy lifestyle and getting preventive care can go a long way to promote health and wellness. Talk with your health care provider about what schedule of regular examinations is right for you. This is a good chance for you to check in with your provider about disease prevention and staying healthy. In between checkups, there are plenty of things you can do on your own. Experts have done a lot of research about which lifestyle changes and preventive measures are most likely to keep you healthy. Ask your health care provider for more information. WEIGHT AND DIET  Eat a healthy diet  Be sure to include plenty of vegetables, fruits, low-fat dairy products, and lean protein.  Do not eat a lot of foods high in solid fats, added sugars, or salt.  Get regular exercise. This is one of the most important things you can do for your health.  Most adults should exercise for at least 150 minutes each week. The exercise should increase your heart rate and make you sweat (moderate-intensity exercise).  Most adults should also do strengthening exercises at least twice a week. This is in addition to the moderate-intensity exercise.  Maintain a healthy weight  Body mass index (BMI) is a measurement that can be used to identify possible weight problems. It estimates body fat based on  height and weight. Your health care provider can help determine your BMI and help you achieve or maintain a healthy weight.  For females 32 years of age and older:   A BMI below 18.5 is considered underweight.  A BMI of 18.5 to 24.9 is normal.  A BMI of 25 to 29.9 is considered overweight.  A BMI of 30 and above is considered obese.  Watch levels of cholesterol and blood lipids  You should start having your blood tested for lipids and cholesterol at 35 years of age, then have this test every 5 years.  You may need to have your cholesterol levels checked more often if:  Your lipid or cholesterol levels are high.  You are older than 35 years of age.  You are at high risk for heart disease.  CANCER SCREENING   Lung Cancer  Lung cancer screening is recommended for adults 64-45 years old who are at high risk for lung cancer because of a history of smoking.  A yearly low-dose CT scan of the lungs is recommended for people who:  Currently smoke.  Have quit within the past 15 years.  Have at least a 30-pack-year history of smoking. A pack year is smoking an average of one pack of cigarettes a day for 1 year.  Yearly screening should continue until it has been 15 years since you quit.  Yearly screening should stop if you develop a health problem that would prevent you from having lung cancer treatment.  Breast Cancer  Practice breast self-awareness. This means understanding how your breasts normally appear and feel.  It also means doing regular breast self-exams. Let your health care provider know about any changes, no matter how small.  If you are in your 20s or 30s, you should have a clinical breast exam (CBE) by a health care provider every 1-3 years as part of a regular health exam.  If you are 52 or older, have a CBE every year. Also consider having a breast X-ray (mammogram) every year.  If you have a family history of breast cancer, talk to your health care  provider about genetic screening.  If you are at high risk for breast cancer, talk to your health care provider about having an MRI and a mammogram every year.  Breast cancer gene (BRCA) assessment is recommended for women who have family members with BRCA-related cancers. BRCA-related cancers include:  Breast.  Ovarian.  Tubal.  Peritoneal cancers.  Results of the assessment will determine the need for genetic counseling and BRCA1 and BRCA2 testing. Cervical Cancer Your health care provider may recommend that you be screened regularly for cancer of the pelvic organs (ovaries, uterus, and vagina). This screening involves a pelvic examination, including checking for microscopic changes to the surface of your cervix (Pap test). You may be encouraged to have this screening done every 3 years, beginning at age 87.  For women ages 58-65, health care providers may recommend pelvic exams and Pap testing every 3 years, or they may recommend the Pap and pelvic exam, combined with testing  for human papilloma virus (HPV), every 5 years. Some types of HPV increase your risk of cervical cancer. Testing for HPV may also be done on women of any age with unclear Pap test results.  Other health care providers may not recommend any screening for nonpregnant women who are considered low risk for pelvic cancer and who do not have symptoms. Ask your health care provider if a screening pelvic exam is right for you.  If you have had past treatment for cervical cancer or a condition that could lead to cancer, you need Pap tests and screening for cancer for at least 20 years after your treatment. If Pap tests have been discontinued, your risk factors (such as having a new sexual partner) need to be reassessed to determine if screening should resume. Some women have medical problems that increase the chance of getting cervical cancer. In these cases, your health care provider may recommend more frequent screening and  Pap tests. Colorectal Cancer  This type of cancer can be detected and often prevented.  Routine colorectal cancer screening usually begins at 35 years of age and continues through 35 years of age.  Your health care provider may recommend screening at an earlier age if you have risk factors for colon cancer.  Your health care provider may also recommend using home test kits to check for hidden blood in the stool.  A small camera at the end of a tube can be used to examine your colon directly (sigmoidoscopy or colonoscopy). This is done to check for the earliest forms of colorectal cancer.  Routine screening usually begins at age 38.  Direct examination of the colon should be repeated every 5-10 years through 35 years of age. However, you may need to be screened more often if early forms of precancerous polyps or small growths are found. Skin Cancer  Check your skin from head to toe regularly.  Tell your health care provider about any new moles or changes in moles, especially if there is a change in a mole's shape or color.  Also tell your health care provider if you have a mole that is larger than the size of a pencil eraser.  Always use sunscreen. Apply sunscreen liberally and repeatedly throughout the day.  Protect yourself by wearing long sleeves, pants, a wide-brimmed hat, and sunglasses whenever you are outside. HEART DISEASE, DIABETES, AND HIGH BLOOD PRESSURE   High blood pressure causes heart disease and increases the risk of stroke. High blood pressure is more likely to develop in:  People who have blood pressure in the high end of the normal range (130-139/85-89 mm Hg).  People who are overweight or obese.  People who are African American.  If you are 70-32 years of age, have your blood pressure checked every 3-5 years. If you are 69 years of age or older, have your blood pressure checked every year. You should have your blood pressure measured twice--once when you are at  a hospital or clinic, and once when you are not at a hospital or clinic. Record the average of the two measurements. To check your blood pressure when you are not at a hospital or clinic, you can use:  An automated blood pressure machine at a pharmacy.  A home blood pressure monitor.  If you are between 56 years and 89 years old, ask your health care provider if you should take aspirin to prevent strokes.  Have regular diabetes screenings. This involves taking a blood sample to check your  fasting blood sugar level.  If you are at a normal weight and have a low risk for diabetes, have this test once every three years after 35 years of age.  If you are overweight and have a high risk for diabetes, consider being tested at a younger age or more often. PREVENTING INFECTION  Hepatitis B  If you have a higher risk for hepatitis B, you should be screened for this virus. You are considered at high risk for hepatitis B if:  You were born in a country where hepatitis B is common. Ask your health care provider which countries are considered high risk.  Your parents were born in a high-risk country, and you have not been immunized against hepatitis B (hepatitis B vaccine).  You have HIV or AIDS.  You use needles to inject street drugs.  You live with someone who has hepatitis B.  You have had sex with someone who has hepatitis B.  You get hemodialysis treatment.  You take certain medicines for conditions, including cancer, organ transplantation, and autoimmune conditions. Hepatitis C  Blood testing is recommended for:  Everyone born from [birth year] through 1965.  Anyone with known risk factors for hepatitis C. Sexually transmitted infections (STIs)  You should be screened for sexually transmitted infections (STIs) including gonorrhea and chlamydia if:  You are sexually active and are younger than 35 years of age.  You are older than 35 years of age and your health care provider tells you  that you are at risk for this type of infection.  Your sexual activity has changed since you were last screened and you are at an increased risk for chlamydia or gonorrhea. Ask your health care provider if you are at risk.  If you do not have HIV, but are at risk, it may be recommended that you take a prescription medicine daily to prevent HIV infection. This is called pre-exposure prophylaxis (PrEP). You are considered at risk if:  You are sexually active and do not regularly use condoms or know the HIV status of your partner(s).  You take drugs by injection.  You are sexually active with a partner who has HIV. Talk with your health care provider about whether you are at high risk of being infected with HIV. If you choose to begin PrEP, you should first be tested for HIV. You should then be tested every 3 months for as long as you are taking PrEP.  PREGNANCY   If you are premenopausal and you may become pregnant, ask your health care provider about preconception counseling.  If you may become pregnant, take 400 to 800 micrograms (mcg) of folic acid every day.  If you want to prevent pregnancy, talk to your health care provider about birth control (contraception). OSTEOPOROSIS AND MENOPAUSE   Osteoporosis is a disease in which the bones lose minerals and strength with aging. This can result in serious bone fractures. Your risk for osteoporosis can be identified using a bone density scan.  If you are 74 years of age or older, or if you are at risk for osteoporosis and fractures, ask your health care provider if you should be screened.  Ask your health care provider whether you should take a calcium or vitamin D supplement to lower your risk for osteoporosis.  Menopause may have certain physical symptoms and risks.  Hormone replacement therapy may reduce some of these symptoms and risks. Talk to your health care provider about whether hormone replacement therapy is right for you.  HOME  CARE INSTRUCTIONS   Schedule regular health, dental, and eye exams.  Stay current with your immunizations.   Do not use any tobacco products including cigarettes, chewing tobacco, or electronic cigarettes.  If you are pregnant, do not drink alcohol.  If you are breastfeeding, limit how much and how often you drink alcohol.  Limit alcohol intake to no more than 1 drink per day for nonpregnant women. One drink equals 12 ounces of beer, 5 ounces of wine, or 1 ounces of hard liquor.  Do not use street drugs.  Do not share needles.  Ask your health care provider for help if you need support or information about quitting drugs.  Tell your health care provider if you often feel depressed.  Tell your health care provider if you have ever been abused or do not feel safe at home.   This information is not intended to replace advice given to you by your health care provider. Make sure you discuss any questions you have with your health care provider.   Document Released: 03/20/2011 Document Revised: 09/25/2014 Document Reviewed: 08/06/2013 Elsevier Interactive Patient Education [employer].

## 2016-04-04 NOTE — Progress Notes (Signed)
Subjective:    [name], female    DOB: 09/12/1981, 35 y.o.   MRN: [id number]  Chief Complaint  Patient presents with  . Annual Exam    C/o knot in  front of her (R) ear  . Nevus    Pt has a mole on her back she is requesting it to be biopsy    HPI:  Lisa Singh is a 35 y.o. female who presents today for an annual wellness visit.   1) Health Maintenance -   Diet - Averages about 3 meals per day consisting of fruits, vegetables, lamb, chicken, fish; minimal processed foods; Caffeine intake of 1-2 cups per week.  Exercise - 3-4x per week; cardio and resistance training.    2) Preventative Exams / Immunizations:  Dental -- Due for exam  Vision -- Up to date   Health Maintenance  Topic Date Due  . TETANUS/TDAP  03/19/2017 (Originally 08/01/2000)  . INFLUENZA VACCINE  04/18/2016  . PAP SMEAR  04/19/2018  . HIV Screening  Completed    There is no immunization history for the selected administration types on file for this patient.   Allergies  Allergen Reactions  . Penicillins Hives     Outpatient Prescriptions Prior to Visit  Medication Sig Dispense Refill  . Multiple Vitamin (MULTIVITAMIN) tablet Take 1 tablet by mouth daily.    Marland Kitchen omeprazole (PRILOSEC) 20 MG capsule Take 1 capsule (20 mg total) by mouth daily. 30 capsule 0   No facility-administered medications prior to visit.     Past Medical History  Diagnosis Date  . No pertinent past medical history   . Allergy   . UTI (lower urinary tract infection)   . Anemia     Iron deficiency     Past Surgical History  Procedure Laterality Date  . Cesarean section       Family History  Problem Relation Age of Onset  . Anesthesia problems Neg Hx   . Hypotension Neg Hx   . Malignant hyperthermia Neg Hx   . Pseudochol deficiency Neg Hx   . Other Neg Hx   . Healthy Mother   . Hypertension Father   . Hypertension Maternal Grandmother   . Anemia Maternal Grandmother   . Cancer  Paternal Grandmother   . Cancer Paternal Grandfather      Social History   Social History  . Marital Status: Married    Spouse Name: N/A  . Number of Children: 2  . Years of Education: 14   Occupational History  . English as a second language teacher    Social History Main Topics  . Smoking status: Never Smoker   . Smokeless tobacco: Never Used  . Alcohol Use: Yes     Comment: socially  . Drug Use: No  . Sexual Activity: Yes   Other Topics Concern  . Not on file   Social History Narrative   Fun: Travel, anything that is spontaneous.   Denies religious beliefs that would effect health care.    Denies abuse and feels safe at home      Review of Systems  Constitutional: Denies fever, chills, fatigue, or significant weight gain/loss. HENT: Head: Denies headache or neck pain Ears: Denies changes in hearing, ringing in ears, earache, drainage Nose: Denies discharge, stuffiness, itching, nosebleed, sinus pain Throat: Denies sore throat, hoarseness, dry mouth, sores, thrush Eyes: Denies loss/changes in vision, pain, redness, blurry/double vision, flashing lights Cardiovascular: Denies chest pain/discomfort, tightness, palpitations, shortness of breath with activity, difficulty lying  down, swelling, sudden awakening with shortness of breath Respiratory: Denies shortness of breath, cough, sputum production, wheezing Gastrointestinal: Denies dysphasia, heartburn, change in appetite, nausea, change in bowel habits, rectal bleeding, constipation, diarrhea, yellow skin or eyes Genitourinary: Denies frequency, urgency, burning/pain, blood in urine, incontinence, change in urinary strength. Musculoskeletal: Denies muscle/joint pain, stiffness, back pain, redness or swelling of joints, trauma Skin: Denies rashes, lumps, itching, dryness, color changes, or hair/nail changes Positive for skin lesion on her lower back Neurological: Denies dizziness, fainting, seizures, weakness, numbness, tingling,  tremor Psychiatric - Denies nervousness, stress, depression or memory loss Endocrine: Denies heat or cold intolerance, sweating, frequent urination, excessive thirst, changes in appetite Hematologic: Denies ease of bruising or bleeding     Objective:     BP 130/78 mmHg  Pulse 84  Temp(Src) 98.7 F (37.1 C) (Oral)  Ht '5\' 2"'  (1.575 m)  Wt 153 lb (69.4 kg)  BMI 27.98 kg/m2  SpO2 99% Nursing note and vital signs reviewed.  Physical Exam  Constitutional: She is oriented to person, place, and time. She appears well-developed and well-nourished.  HENT:  Head: Normocephalic.  Right Ear: Hearing, tympanic membrane, external ear and ear canal normal.  Left Ear: Hearing, tympanic membrane, external ear and ear canal normal.  Nose: Nose normal.  Mouth/Throat: Uvula is midline, oropharynx is clear and moist and mucous membranes are normal.  Eyes: Conjunctivae and EOM are normal. Pupils are equal, round, and reactive to light.  Neck: Neck supple. No JVD present. No tracheal deviation present. No thyromegaly present.  Cardiovascular: Normal rate, regular rhythm, normal heart sounds and intact distal pulses.   Pulmonary/Chest: Effort normal and breath sounds normal.  Abdominal: Soft. Bowel sounds are normal. She exhibits no distension and no mass. There is no tenderness. There is no rebound and no guarding.  Musculoskeletal: Normal range of motion. She exhibits no edema or tenderness.  Lymphadenopathy:    She has no cervical adenopathy.  Neurological: She is alert and oriented to person, place, and time. She has normal reflexes. No cranial nerve deficit. She exhibits normal muscle tone. Coordination normal.  Skin: Skin is warm and dry.  Approximately 0.5 inch brown colored, raised annular lesion with defined borders and stuck on appearance.  Psychiatric: She has a normal mood and affect. Her behavior is normal. Judgment and thought content normal.   Procedure Note Cryosurgical removal of a  benign lesion.  Risks of procedure including infection, change in skin color, and incomplete resolution of lesion were discussed with alternative options. Patient wished to continue and provided verbal consent. The benign lesion on the lower back was identified and confirmed by patient. Timeout was performed identifying patient by name and date of birth as well as medical record number. The site was cleansed using alcohol swabs in a circular motion from the center concentrically outwards and allowed to dry. The Histofreezer Cryosurgical Kit was prepared in accordance with manufacturers instructions and applied to the site for the recommended 40 seconds. The site was covered with a band-aid. The procedure was tolerated without complication. Post care instructions were provided.     Assessment & Plan:   Problem List Items Addressed This Visit      Musculoskeletal and Integument   Benign skin lesion of lower back    Benign lesion in skin most likely seborrheic keratosis was cracked surgically removed without complication. Post care and return instructions provided. Follow if as needed.         Other   Encounter for  routine adult medical exam with abnormal findings - Primary    1) Anticipatory Guidance: Discussed importance of wearing a seatbelt while driving and not texting while driving; changing batteries in smoke detector at least once annually; wearing suntan lotion when outside; eating a balanced and moderate diet; getting physical activity at least 30 minutes per day.  2) Immunizations / Screenings / Labs:  Declines tetanus. All other immunizations are up-to-date per recommendations. Due for a dental screen encouraged to be completed independently. Obtain vitamin D for vitamin D deficiency screening. All other screenings are up-to-date per recommendations. Obtain CBC, CMET, and Lipid profile.  Overall well exam with risk factors for cardiovascular disease being minimal at this time. Her BMI  is 27.98 indicating overweight. Recommend weight loss of 5-10% of current body weight through nutrition and physical activity. Specifically altering nutrient intake focused on nutrient dense foods and decreasing processed/sugary foods. Continue other healthy lifestyle behaviors and choices. Follow-up prevention exam in 1 year. Follow-up office visit pending blood work as indicated.       Relevant Orders   CBC   Comprehensive metabolic panel   Lipid panel   VITAMIN D 25 Hydroxy (Vit-D Deficiency, Fractures)       I have discontinued Ms. Bass's omeprazole. I am also having her maintain her multivitamin.    Follow-up: Return if symptoms worsen or fail to improve.   Mauricio Po, FNP

## 2016-08-27 ENCOUNTER — Ambulatory Visit (HOSPITAL_COMMUNITY)
Admission: EM | Admit: 2016-08-27 | Discharge: 2016-08-27 | Disposition: A | Discharge status: Home / Self Care | Payer: Managed Care, Other (non HMO) | Attending: Emergency Medicine | Admitting: Emergency Medicine

## 2016-08-27 ENCOUNTER — Encounter (HOSPITAL_COMMUNITY): Payer: Self-pay | Admitting: Emergency Medicine

## 2016-08-27 DIAGNOSIS — R21 Rash and other nonspecific skin eruption: Secondary | ICD-10-CM

## 2016-08-27 LAB — CBC WITH DIFFERENTIAL/PLATELET
BASOS ABS: 0.1 10*3/uL (ref 0.0–0.1)
BASOS PCT: 1 %
EOS ABS: 0.1 10*3/uL (ref 0.0–0.7)
Eosinophils Relative: 2 %
HCT: 34.2 % — ABNORMAL LOW (ref 36.0–46.0)
HEMOGLOBIN: 10.9 g/dL — AB (ref 12.0–15.0)
LYMPHS PCT: 41 %
Lymphs Abs: 2.3 10*3/uL (ref 0.7–4.0)
MCH: 21.8 pg — AB (ref 26.0–34.0)
MCHC: 31.9 g/dL (ref 30.0–36.0)
MCV: 68.4 fL — ABNORMAL LOW (ref 78.0–100.0)
MONO ABS: 0.3 10*3/uL (ref 0.1–1.0)
Monocytes Relative: 6 %
NEUTROS ABS: 2.9 10*3/uL (ref 1.7–7.7)
NEUTROS PCT: 50 %
PLATELETS: 253 10*3/uL (ref 150–400)
RBC: 5 MIL/uL (ref 3.87–5.11)
RDW: 14.5 % (ref 11.5–15.5)
WBC: 5.7 10*3/uL (ref 4.0–10.5)

## 2016-08-27 MED ORDER — CLOBETASOL PROPIONATE 0.05 % EX OINT: 1.0000 "application " | TOPICAL_OINTMENT | Freq: Two times a day (BID) | CUTANEOUS | 0 refills | Status: DC

## 2016-08-27 NOTE — ED Provider Notes (Signed)
[facility]    CSN: [id number] Arrival date & time: 08/27/16  1704     History   Chief Complaint Chief Complaint  Patient presents with  . Rash    HPI Lisa Singh is a 35 y.o. female.   HPI She is a 35 year old woman here for evaluation of rash. She reports a rash on her left neck. It started about 3 days ago. It is a little sore. There is an underlying swollen lymph node. She denies any fevers. No known insect bites. She had a similar rash a couple years ago. She thinks it was maybe in the same spot.  Past Medical History:  Diagnosis Date  . Allergy   . Anemia    Iron deficiency  . No pertinent past medical history   . UTI (lower urinary tract infection)     Patient Active Problem List   Diagnosis Date Noted  . Encounter for routine adult medical exam with abnormal findings 04/04/2016  . Benign skin lesion of lower back 04/04/2016  . AP (abdominal pain) 05/20/2015  . Flank pain 05/20/2015  . Abnormal stools 04/12/2015  . Left flank pain, chronic 04/06/2015  . CN (constipation) 04/06/2015  . Anemia 03/11/2015  . Seasonal allergies 03/11/2015  . Mid back pain 03/11/2015    Past Surgical History:  Procedure Laterality Date  . CESAREAN SECTION      OB History    Gravida Para Term Preterm AB Living   3 2 1 1 1 2    SAB TAB Ectopic Multiple Live Births   0 1 0 0 1       Home Medications    Prior to Admission medications   Medication Sig Start Date End Date Taking? Authorizing Provider  Multiple Vitamin (MULTIVITAMIN) tablet Take 1 tablet by mouth daily.   Yes Historical Provider, MD  clobetasol ointment (TEMOVATE) 0.05 % Apply 1 application topically 2 (two) times daily. 08/27/16   Charm RingsErin J Zael Shuman, MD    Family History Family History  Problem Relation Age of Onset  . Healthy Mother   . Hypertension Father   . Hypertension Maternal Grandmother   . Anemia Maternal Grandmother   . Cancer Paternal Grandmother   . Cancer Paternal Grandfather    . Anesthesia problems Neg Hx   . Hypotension Neg Hx   . Malignant hyperthermia Neg Hx   . Pseudochol deficiency Neg Hx   . Other Neg Hx     Social History Social History  Substance Use Topics  . Smoking status: Never Smoker  . Smokeless tobacco: Never Used  . Alcohol use Yes     Comment: socially     Allergies   Penicillins   Review of Systems Review of Systems As in history of present illness  Physical Exam Triage Vital Signs ED Triage Vitals  Enc Vitals Group     BP 08/27/16 1743 125/72     Pulse Rate 08/27/16 1743 90     Resp 08/27/16 1743 16     Temp 08/27/16 1743 98.9 F (37.2 C)     Temp Source 08/27/16 1743 Oral     SpO2 08/27/16 1743 100 %     Weight 08/27/16 1745 150 lb (68 kg)     Height 08/27/16 1745 5' 2.5" (1.588 m)     Head Circumference --      Peak Flow --      Pain Score 08/27/16 1746 2     Pain Loc --  Pain Edu? --      Excl. in GC? --    No data found.   Updated Vital Signs BP 125/72 (BP Location: Left Arm)   Pulse 90   Temp 98.9 F (37.2 C) (Oral)   Resp 16   Ht 5' 2.5" (1.588 m)   Wt 150 lb (68 kg)   LMP 08/13/2016   SpO2 100%   BMI 27.00 kg/m   Visual Acuity Right Eye Distance:   Left Eye Distance:   Bilateral Distance:    Right Eye Near:   Left Eye Near:    Bilateral Near:     Physical Exam  Constitutional: She is oriented to person, place, and time. She appears well-developed and well-nourished. No distress.  Cardiovascular: Normal rate.   Pulmonary/Chest: Effort normal.  Lymphadenopathy:    She has cervical adenopathy (one under the rash and one distal).  Neurological: She is alert and oriented to person, place, and time.  Skin: Rash (cluster of papules with black core on the left lateral neck) noted.     UC Treatments / Results  Labs (all labs ordered are listed, but only abnormal results are displayed) Labs Reviewed  CBC WITH DIFFERENTIAL/PLATELET    EKG  EKG Interpretation None        Radiology No results found.  Procedures Procedures (including critical care time)  Medications Ordered in UC Medications - No data to display   Initial Impression / Assessment and Plan / UC Course  I have reviewed the triage vital signs and the nursing notes.  Pertinent labs & imaging results that were available during my care of the patient were reviewed by me and considered in my medical decision making (see chart for details).  Clinical Course     Did not appear to be infectious. Would be unusual presentation for shingles. We'll treat for possible insect bite with clobetasol as that is what she uses for her eczema. Given the lymph nodes, she is concerned about lymphoma due to a friend's experience. We'll draw CBC with differential and call her with the results. Follow-up as needed.   Final Clinical Impressions(s) / UC Diagnoses   Final diagnoses:  Rash    New Prescriptions New Prescriptions   CLOBETASOL OINTMENT (TEMOVATE) 0.05 %    Apply 1 application topically 2 (two) times daily.     Charm RingsErin J Obie Kallenbach, MD 08/27/16 [phone]

## 2016-08-27 NOTE — Discharge Instructions (Signed)
The rash is likely a reaction to an insect bite. Use the clobetasol ointment twice a day for 1 week. We will call you with the results of the blood work and a day or 2. Follow-up as needed.

## 2016-08-27 NOTE — ED Triage Notes (Signed)
PT reports rash on left side of neck for a few days. PT reports she has had a similar rash before. PT has history of eczema, but this is different. PT is unsure whether it is due to the use of a new product or new brand of hair extensions.

## 2016-08-30 ENCOUNTER — Telehealth (HOSPITAL_COMMUNITY): Payer: Self-pay | Admitting: Emergency Medicine

## 2016-08-30 NOTE — Telephone Encounter (Signed)
Returned pt's call... notified of lab results from visit 08/27/16 (See lab note from visit on 08/27/16) Pt ID'd properly... Reports feeling fine Adv pt if sx are not getting better to return or to f/u w/PCP Pt verb understanding.

## 2016-12-29 ENCOUNTER — Encounter: Payer: Self-pay | Admitting: Family

## 2016-12-29 ENCOUNTER — Ambulatory Visit (INDEPENDENT_AMBULATORY_CARE_PROVIDER_SITE_OTHER): Payer: 59 | Admitting: Family

## 2016-12-29 DIAGNOSIS — S8012XA Contusion of left lower leg, initial encounter: Secondary | ICD-10-CM | POA: Diagnosis not present

## 2016-12-29 DIAGNOSIS — T148XXA Other injury of unspecified body region, initial encounter: Secondary | ICD-10-CM | POA: Insufficient documentation

## 2016-12-29 NOTE — Patient Instructions (Addendum)
Thank you for choosing [facility].  SUMMARY AND INSTRUCTIONS:  Ice / Moist heat x 20 minutes every 2 hours and after activity.  Ibuprofen as needed for discomfort.   Follow up:  If your symptoms worsen or fail to improve, please contact our office for further instruction, or in case of emergency go directly to the emergency room at the closest medical facility.     Hematoma A hematoma is a collection of blood. The collection of blood can turn into a hard, painful lump under the skin. Your skin may turn blue or yellow if the hematoma is close to the surface of the skin. Most hematomas get better in a few days to weeks. Some hematomas are serious and need medical care. Hematomas can be very small or very big. Follow these instructions at home:  Apply ice to the injured area:  Put ice in a plastic bag.  Place a towel between your skin and the bag.  Leave the ice on for 20 minutes, 2-3 times a day for the first 1 to 2 days.  After the first 2 days, switch to using warm packs on the injured area.  Raise (elevate) the injured area to lessen pain and puffiness (swelling). You may also wrap the area with an elastic bandage. Make sure the bandage is not wrapped too tight.  If you have a painful hematoma on your leg or foot, you may use crutches for a couple days.  Only take medicines as told by your doctor. Get help right away if:  Your pain gets worse.  Your pain is not controlled with medicine.  You have a fever.  Your puffiness gets worse.  Your skin turns more blue or yellow.  Your skin over the hematoma breaks or starts bleeding.  Your hematoma is in your chest or belly (abdomen) and you are short of breath, feel weak, or have a change in consciousness.  Your hematoma is on your scalp and you have a headache that gets worse or a change in alertness or consciousness. This information is not intended to replace advice given to you by your health care provider. Make  sure you discuss any questions you have with your health care provider. Document Released: 10/12/2004 Document Revised: 02/10/2016 Document Reviewed: 02/12/2013 Elsevier Interactive Patient Education  2017 [employer].

## 2016-12-29 NOTE — Assessment & Plan Note (Signed)
Symptoms and exam consistent with mild contusion appears resolving with conservative treatment. Recommend continued ice/moist heat and over-the-counter anti-inflammatories as needed. No indication for imaging at this time. Follow-up if symptoms worsen or do not improve.

## 2016-12-29 NOTE — Progress Notes (Signed)
   Subjective:    [name], female    DOB: 1981/01/27, 37 y.o.   MRN: [id number]  Chief Complaint  Patient presents with  . Leg Pain    hit her left leg 3 weeks ago and got a knot on her shin that is still there, does have pain     HPI:  Lisa Singh is a 36 y.o. female who  has a past medical history of Allergy; Anemia; No pertinent past medical history; and UTI (lower urinary tract infection). and presents today for an acute office visit.   This is a new problem. Associated symptom of pain and a knot located on her left shin has been going on for about 3 weeks. Describes the knot as having decreased size since initial onset. Believes she may have struck her leg against a desk. Denies any sounds or sensations heard or felt. Able to weight bare without complication. Modifying factors include ice which seems to have helped.  Allergies  Allergen Reactions  . Penicillins Hives      Outpatient Medications Prior to Visit  Medication Sig Dispense Refill  . clobetasol ointment (TEMOVATE) 0.05 % Apply 1 application topically 2 (two) times daily. 30 g 0  . Multiple Vitamin (MULTIVITAMIN) tablet Take 1 tablet by mouth daily.     No facility-administered medications prior to visit.      Review of Systems  Constitutional: Negative for chills and fever.  Musculoskeletal:       Positive for left shin pain.  Neurological: Negative for weakness and numbness.      Objective:    BP 132/80 (BP Location: Left Arm, Patient Position: Sitting, Cuff Size: Normal)   Pulse 80   Temp 98.2 F (36.8 C) (Oral)   Resp 16   Ht 5' 2.5" (1.588 m)   Wt 156 lb 6.4 oz (70.9 kg)   SpO2 98%   BMI 28.15 kg/m  Nursing note and vital signs reviewed.  Physical Exam  Constitutional: She is oriented to person, place, and time. She appears well-developed and well-nourished. No distress.  Cardiovascular: Normal rate, regular rhythm, normal heart sounds and intact distal pulses.     Pulmonary/Chest: Effort normal and breath sounds normal.  Musculoskeletal:  Left leg - no obvious deformity or discoloration with mild edema. Mild tenderness elicited over anterior tibia with no crepitus or deformity noted. Range of motion of ankle and knee are within normal limits. Distal pulses are intact and appropriate.  Neurological: She is alert and oriented to person, place, and time.  Skin: Skin is warm and dry.  Psychiatric: She has a normal mood and affect. Her behavior is normal. Judgment and thought content normal.       Assessment & Plan:   Problem List Items Addressed This Visit      Other   Contusion    Symptoms and exam consistent with mild contusion appears resolving with conservative treatment. Recommend continued ice/moist heat and over-the-counter anti-inflammatories as needed. No indication for imaging at this time. Follow-up if symptoms worsen or do not improve.          I am having Lisa Singh maintain her multivitamin and clobetasol ointment.   Follow-up: Return if symptoms worsen or fail to improve.  Jeanine Luz, FNP

## 2017-08-24 ENCOUNTER — Ambulatory Visit: Payer: 59 | Admitting: Family

## 2017-08-24 ENCOUNTER — Encounter: Payer: Self-pay | Admitting: Family

## 2017-08-24 VITALS — BP 116/74 | HR 69 | Temp 97.7°F | Ht 62.5 in | Wt 155.0 lb

## 2017-08-24 DIAGNOSIS — M79601 Pain in right arm: Secondary | ICD-10-CM | POA: Diagnosis not present

## 2017-08-24 MED ORDER — MELOXICAM 15 MG PO TABS: 15.0000 mg | ORAL_TABLET | Freq: Every day | ORAL | 0 refills | Status: DC

## 2017-08-24 NOTE — Progress Notes (Signed)
Lisa Singh is a 36 y.o. female with the following history as recorded in EpicCare:  Patient Active Problem List   Diagnosis Date Noted  . Contusion 12/29/2016  . Encounter for routine adult medical exam with abnormal findings 04/04/2016  . Benign skin lesion of lower back 04/04/2016  . AP (abdominal pain) 05/20/2015  . Flank pain 05/20/2015  . Abnormal stools 04/12/2015  . Left flank pain, chronic 04/06/2015  . CN (constipation) 04/06/2015  . Anemia 03/11/2015  . Seasonal allergies 03/11/2015  . Mid back pain 03/11/2015    Current Outpatient Medications  Medication Sig Dispense Refill  . clobetasol ointment (TEMOVATE) 0.05 % Apply 1 application topically 2 (two) times daily. 30 g 0  . Multiple Vitamin (MULTIVITAMIN) tablet Take 1 tablet by mouth daily.    . meloxicam (MOBIC) 15 MG tablet Take 1 tablet (15 mg total) by mouth daily. 30 tablet 0   No current facility-administered medications for this visit.     Allergies: Penicillins  Past Medical History:  Diagnosis Date  . Allergy   . Anemia    Iron deficiency  . No pertinent past medical history   . UTI (lower urinary tract infection)     Past Surgical History:  Procedure Laterality Date  . CESAREAN SECTION      Family History  Problem Relation Age of Onset  . Healthy Mother   . Hypertension Father   . Hypertension Maternal Grandmother   . Anemia Maternal Grandmother   . Cancer Paternal Grandmother   . Cancer Paternal Grandfather   . Anesthesia problems Neg Hx   . Hypotension Neg Hx   . Malignant hyperthermia Neg Hx   . Pseudochol deficiency Neg Hx   . Other Neg Hx     Social History   Tobacco Use  . Smoking status: Never Smoker  . Smokeless tobacco: Never Used  Substance Use Topics  . Alcohol use: Yes    Comment: socially    Subjective:  R arm pain x 1 week; symptoms started after exercising with trainer- did a new type of exercise with increased weight/ reps; does work on a computer and has noticed  some "tingling" sensation into her right fingertips; used 2 OTC Advil last night with some benefit; has been using heat and thinks this made her symptoms worse. Feels that she needs to use some type of sling to support her arm due to the pain.   Objective:  Vitals:   08/24/17 1019  BP: 116/74  Pulse: 69  Temp: 97.7 F (36.5 C)  TempSrc: Oral  SpO2: 99%  Weight: 155 lb (70.3 kg)  Height: 5' 2.5" (1.588 m)    General: Well developed, well nourished, in no acute distress  Skin : Warm and dry.  Head: Normocephalic and atraumatic  Lungs: Respirations unlabored; clear to auscultation bilaterally without wheeze, rales, rhonchi  Musculoskeletal: No deformities; no active joint inflammation  Extremities: No edema, cyanosis, clubbing  Vessels: Symmetric bilaterally  Neurologic: Alert and oriented; speech intact; face symmetrical; moves all extremities well; CNII-XII intact without focal deficit   Assessment:  1. Right arm pain     Plan:  Suspect acute injury from work-out; will give trial of Mobic 15 mg daily; apply ice rather than heat; she can purchase a sleeve OTC; follow-up worse, no better- will refer to sports medicine if symptoms persist.  No Follow-up on file.  No orders of the defined types were placed in this encounter.   Requested Prescriptions   Signed Prescriptions  Disp Refills  . meloxicam (MOBIC) 15 MG tablet 30 tablet 0    Sig: Take 1 tablet (15 mg total) by mouth daily.

## 2017-10-11 ENCOUNTER — Other Ambulatory Visit: Payer: Self-pay | Admitting: Family

## 2017-12-12 ENCOUNTER — Encounter: Payer: Self-pay | Admitting: Family

## 2017-12-12 ENCOUNTER — Ambulatory Visit: Payer: 59 | Admitting: Family

## 2017-12-12 VITALS — BP 116/78 | HR 74 | Temp 98.0°F | Ht 62.0 in | Wt 155.0 lb

## 2017-12-12 DIAGNOSIS — M79641 Pain in right hand: Secondary | ICD-10-CM | POA: Diagnosis not present

## 2017-12-12 NOTE — Progress Notes (Signed)
Lisa Singh is a 37 y.o. female with the following history as recorded in EpicCare:  Patient Active Problem List   Diagnosis Date Noted  . 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 Contusion 12/29/2016  . Encounter for routine adult medical exam with abnormal findings 04/04/2016  . Benign skin lesion of lower back 04/04/2016  . AP (abdominal pain) 05/20/2015  . Flank pain 05/20/2015  . Abnormal stools 04/12/2015  . Left flank pain, chronic 04/06/2015  . CN (constipation) 04/06/2015  . Anemia 03/11/2015  . Seasonal allergies 03/11/2015  . Mid back pain 03/11/2015    Current Outpatient Medications  Medication Sig Dispense Refill  . clobetasol ointment (TEMOVATE) 0.05 % Apply 1 application topically 2 (two) times daily. 30 g 0  . meloxicam (MOBIC) 15 MG tablet Take 1 tablet (15 mg total) by mouth daily. 30 tablet 0  . Multiple Vitamin (MULTIVITAMIN) tablet Take 1 tablet by mouth daily.     No current facility-administered medications for this visit.     Allergies: Penicillins  Past Medical History:  Diagnosis Date  . Allergy   . Anemia    Iron deficiency  . No pertinent past medical history   . UTI (lower urinary tract infection)     Past Surgical History:  Procedure Laterality Date  . CESAREAN SECTION      Family History  Problem Relation Age of Onset  . Healthy Mother   . Hypertension Father   . Hypertension Maternal Grandmother   . Anemia Maternal Grandmother   . Cancer Paternal Grandmother   . Cancer Paternal Grandfather   . Anesthesia problems Neg Hx   . Hypotension Neg Hx   . Malignant hyperthermia Neg Hx   . Pseudochol deficiency Neg Hx   . Other Neg Hx     Social History   Tobacco Use  . Smoking status: Never Smoker  . Smokeless tobacco: Never Used  Substance Use Topics  . Alcohol use: Yes    Comment: socially    Subjective:  Patient was seen approximately 3 months ago with concerns for upper arm pain that developed after working out with  her trainer; notes that symptoms had improved some with use of Mobic that was given at that time; states that in the past week- has developed a cyst on the underside of her wrist and is not concerned that she cannot open and close her index and thumb completely-feels like it locks on her;   Objective:  Vitals:   12/12/17 1410  BP: 116/78  Pulse: 74  Temp: 98 F (36.7 C)  TempSrc: Oral  SpO2: 98%  Weight: 155 lb (70.3 kg)  Height: 5\' 2"  (1.575 m)    General: Well developed, well nourished, in no acute distress  Skin : Warm and dry. ? Small cystic area on inside of right thumb Head: Normocephalic and atraumatic  Lungs: Respirations unlabored; clear to auscultation bilaterally without wheeze, rales, rhonchi  Musculoskeletal: No deformities; no active joint inflammation; locking sensation noted when she moves her thumb and finger;  Extremities: No edema, cyanosis, clubbing  Vessels: Symmetric bilaterally  Neurologic: Alert and oriented; speech intact; face symmetrical; moves all extremities well; CNII-XII intact without focal deficit  Assessment:  1. Pain of right hand     Plan:  Due to persisting symptoms and new onset of cyst/ locking sensation in her hand, will refer to sports  Medicine for further evaluation; she is in agreement and appointment is scheduled.   Return in about 1 day (  around 12/13/2017) for with Dr. Jordan Likes.  Orders Placed This Encounter  Procedures  . DG Hand Complete Right    Standing Status:   Future    Standing Expiration Date:   02/12/2019    Order Specific Question:   Reason for Exam (SYMPTOM  OR DIAGNOSIS REQUIRED)    Answer:   pain/ swelling    Order Specific Question:   Is patient pregnant?    Answer:   No    Order Specific Question:   Preferred imaging location?    Answer:   [facility]    Order Specific Question:   Radiology Contrast Protocol - do NOT remove file path    Answer:   [email or web address]     Requested Prescriptions    No prescriptions requested or ordered in this encounter

## 2017-12-13 ENCOUNTER — Ambulatory Visit (INDEPENDENT_AMBULATORY_CARE_PROVIDER_SITE_OTHER): Payer: 59 | Admitting: Family Medicine

## 2017-12-13 ENCOUNTER — Encounter: Payer: Self-pay | Admitting: Family Medicine

## 2017-12-13 VITALS — BP 110/62 | HR 71 | Temp 97.9°F | Ht 62.0 in | Wt 155.0 lb

## 2017-12-13 DIAGNOSIS — G5610 Other lesions of median nerve, unspecified upper limb: Secondary | ICD-10-CM | POA: Insufficient documentation

## 2017-12-13 DIAGNOSIS — M25531 Pain in right wrist: Secondary | ICD-10-CM | POA: Diagnosis not present

## 2017-12-13 NOTE — Assessment & Plan Note (Signed)
Appears her nerve has been affected and this has been ongoing since December with limited improvement. Has taken mobic. Has been passively moving these digits.  - EMG  - counseled on HEP  - counseled on conservative care

## 2017-12-13 NOTE — Progress Notes (Signed)
Lisa NasutiCamillia Singh - 37 y.o. female MRN [id number]  Date of birth: 1980/11/02  SUBJECTIVE:  Including CC & ROS.  Chief Complaint  Patient presents with  . Right wrist pain    Lisa Singh is a [age] y.o. female that is presenting with right wrist pain. Ongoing for three months. She was working out at [employer] and noticed the pain afterwards. Denies injury. Pain is located antecubital and radiates down to midportion of her wrist. She feels a knot on her wrist.She has been taking Mobic with some improvement. She is unable to make an "ok" sign with her pointer finger and thumb.    Review of Systems  Constitutional: Negative for fever.  HENT: Negative for congestion.   Respiratory: Negative for cough.   Musculoskeletal: Negative for gait problem.  Skin: Negative for color change.  Neurological: Positive for weakness.  Hematological: Negative for adenopathy.  Psychiatric/Behavioral: Negative for agitation.    HISTORY: Past Medical, Surgical, Social, and Family History Reviewed & Updated per EMR.   Pertinent Historical Findings include:  Past Medical History:  Diagnosis Date  . Allergy   . Anemia    Iron deficiency  . No pertinent past medical history   . UTI (lower urinary tract infection)     Past Surgical History:  Procedure Laterality Date  . CESAREAN SECTION      Allergies  Allergen Reactions  . Penicillins Hives    Family History  Problem Relation Age of Onset  . Healthy Mother   . Hypertension Father   . Hypertension Maternal Grandmother   . Anemia Maternal Grandmother   . Cancer Paternal Grandmother   . Cancer Paternal Grandfather   . Anesthesia problems Neg Hx   . Hypotension Neg Hx   . Malignant hyperthermia Neg Hx   . Pseudochol deficiency Neg Hx   . Other Neg Hx      Social History   Socioeconomic History  . Marital status: Married    Spouse name: Not on file  . Number of children: 2  . Years of education: 3814  . Highest education level: Not on file    Occupational History  . 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 Financial resource strain: Not on file  . Food insecurity:    Worry: Not on file    Inability: Not on file  . Transportation needs:    Medical: Not on file    Non-medical: Not on file  Tobacco Use  . Smoking status: Never Smoker  . Smokeless tobacco: Never Used  Substance and Sexual Activity  . Alcohol use: Yes    Comment: socially  . Drug use: No  . Sexual activity: Yes  Lifestyle  . Physical activity:    Days per week: Not on file    Minutes per session: Not on file  . Stress: Not on file  Relationships  . Social connections:    Talks on phone: Not on file    Gets together: Not on file    Attends religious service: Not on file    Active member of club or organization: Not on file    Attends meetings of clubs or organizations: Not on file    Relationship status: Not on file  . Intimate partner violence:    Fear of current or ex partner: Not on file    Emotionally abused: Not on file    Physically abused: Not on file    Forced sexual activity: Not on file  Other Topics Concern  .  Not on file  Social History Narrative   Fun: Travel, anything that is spontaneous.   Denies religious beliefs that would effect health care.    Denies abuse and feels safe at home     PHYSICAL EXAM:  VS: BP 110/62 (BP Location: Left Arm, Patient Position: Sitting, Cuff Size: Normal)   Pulse 71   Temp 97.9 F (36.6 C) (Oral)   Ht 5\' 2"  (1.575 m)   Wt 155 lb (70.3 kg)   SpO2 100%   BMI 28.35 kg/m  Physical Exam Gen: NAD, alert, cooperative with exam, well-appearing ENT: normal lips, normal nasal mucosa,  Eye: normal EOM, normal conjunctiva and lids CV:  no edema, +2 pedal pulses   Resp: no accessory muscle use, non-labored,  Skin: no rashes, no areas of induration  Neuro: normal tone, normal sensation to touch Psych:  normal insight, alert and oriented MSK:  Right wrist:  Cyst structure over the flexor tendon  on the volar wrist  Unable to make an ok sign with pointer and thumb  Unable to flex the PIP of the 2nd digit and IP of the thumb  Normal finger ROM with other digit.  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 Has taken mobic. Has been passively moving these digits.  - EMG  - counseled on HEP  - counseled on conservative care   Right wrist pain Possible that she has a ganglion on her volar wrist. Doesn't appear to be associated with nerve or tendon  - consider aspiration if painful

## 2017-12-13 NOTE — Patient Instructions (Signed)
Please move your pointer finger and thumb on a regular basis  Please take an anti-inflammatory if there is pain  I will call you with the results one they have returned.

## 2017-12-13 NOTE — Assessment & Plan Note (Signed)
Possible that she has a ganglion on her volar wrist. Doesn't appear to be associated with nerve or tendon  - consider aspiration if painful

## 2018-01-18 ENCOUNTER — Encounter: Payer: Self-pay | Admitting: Family Medicine

## 2018-02-05 ENCOUNTER — Other Ambulatory Visit: Payer: Self-pay

## 2018-02-05 ENCOUNTER — Encounter (HOSPITAL_COMMUNITY): Payer: Self-pay | Admitting: Emergency Medicine

## 2018-02-05 ENCOUNTER — Emergency Department (HOSPITAL_COMMUNITY)
Admission: EM | Admit: 2018-02-05 | Discharge: 2018-02-06 | Disposition: A | Discharge status: Home / Self Care | Payer: 59 | Attending: Emergency Medicine | Admitting: Emergency Medicine

## 2018-02-05 DIAGNOSIS — Y929 Unspecified place or not applicable: Secondary | ICD-10-CM | POA: Diagnosis not present

## 2018-02-05 DIAGNOSIS — Y999 Unspecified external cause status: Secondary | ICD-10-CM | POA: Diagnosis not present

## 2018-02-05 DIAGNOSIS — Y9389 Activity, other specified: Secondary | ICD-10-CM | POA: Insufficient documentation

## 2018-02-05 DIAGNOSIS — S39012A Strain of muscle, fascia and tendon of lower back, initial encounter: Secondary | ICD-10-CM | POA: Insufficient documentation

## 2018-02-05 DIAGNOSIS — Z79899 Other long term (current) drug therapy: Secondary | ICD-10-CM | POA: Insufficient documentation
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
## 2018-02-06 ENCOUNTER — Encounter (HOSPITAL_COMMUNITY): Payer: Self-pay | Admitting: *Deleted

## 2018-02-06 ENCOUNTER — Other Ambulatory Visit: Payer: Self-pay

## 2018-02-06 ENCOUNTER — Emergency Department (HOSPITAL_COMMUNITY)
Admission: EM | Admit: 2018-02-06 | Discharge: 2018-02-06 | Disposition: A | Discharge status: Home / Self Care | Payer: 59 | Source: Home / Self Care | Attending: Emergency Medicine | Admitting: Emergency Medicine

## 2018-02-06 ENCOUNTER — Emergency Department (HOSPITAL_COMMUNITY): Payer: 59

## 2018-02-06 DIAGNOSIS — M545 Low back pain: Secondary | ICD-10-CM | POA: Insufficient documentation

## 2018-02-06 DIAGNOSIS — Z79899 Other long term (current) drug therapy: Secondary | ICD-10-CM | POA: Insufficient documentation

## 2018-02-06 DIAGNOSIS — J302 Other seasonal allergic rhinitis: Secondary | ICD-10-CM

## 2018-02-06 DIAGNOSIS — S39012A Strain of muscle, fascia and tendon of lower back, initial encounter: Secondary | ICD-10-CM | POA: Diagnosis not present

## 2018-02-06 LAB — POC URINE PREG, ED: PREG TEST UR: NEGATIVE

## 2018-02-06 MED ORDER — DIAZEPAM 5 MG PO TABS: 5.0000 mg | ORAL_TABLET | Freq: Once | ORAL | Status: DC

## 2018-02-06 MED ORDER — IBUPROFEN 800 MG PO TABS
800.0000 mg | ORAL_TABLET | Freq: Once | ORAL | Status: AC
  Administered 2018-02-06: 800 mg via ORAL
  Filled 2018-02-06: qty 1

## 2018-02-06 MED ORDER — CYCLOBENZAPRINE HCL 5 MG PO TABS: 5.0000 mg | ORAL_TABLET | Freq: Two times a day (BID) | ORAL | 0 refills | Status: DC | PRN

## 2018-02-06 MED ORDER — HYDROCODONE-ACETAMINOPHEN 5-325 MG PO TABS
1.0000 | ORAL_TABLET | Freq: Once | ORAL | Status: AC
  Administered 2018-02-06: 1 via ORAL
  Filled 2018-02-06: qty 1

## 2018-02-06 MED ORDER — CYCLOBENZAPRINE HCL 10 MG PO TABS
5.0000 mg | ORAL_TABLET | Freq: Once | ORAL | Status: AC
  Administered 2018-02-06: 5 mg via ORAL
  Filled 2018-02-06: qty 1

## 2018-02-06 MED ORDER — ACETAMINOPHEN 500 MG PO TABS
1000.0000 mg | ORAL_TABLET | Freq: Once | ORAL | Status: AC
Start: 2018-02-06 — End: 2018-02-06
  Administered 2018-02-06: 1000 mg via ORAL
  Filled 2018-02-06: qty 2

## 2018-02-06 MED ORDER — OXYCODONE HCL 5 MG PO TABS: 5.0000 mg | ORAL_TABLET | Freq: Once | ORAL | Status: DC

## 2018-02-06 NOTE — ED Notes (Signed)
ED Provider at bedside. 
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
## 2018-02-06 NOTE — ED Notes (Signed)
Pt refused valium and oxycodone at this time. Will continue to monitor.
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
## 2018-02-06 NOTE — Discharge Instructions (Signed)
Take 4 over the counter ibuprofen tablets 3 times a day or 2 over-the-counter naproxen tablets twice a day for pain. Also take tylenol (2 extra strength) four times a day.    You will hurt worse tomorrow.  Return for abdominal pain or shortness of breath. Follow up with your Family doc.

## 2018-02-07 ENCOUNTER — Ambulatory Visit: Payer: 59 | Admitting: Family

## 2018-02-07 ENCOUNTER — Encounter: Payer: Self-pay | Admitting: Family

## 2018-02-07 VITALS — BP 104/70 | HR 88 | Temp 99.4°F | Ht 63.0 in | Wt 153.1 lb

## 2018-02-07 DIAGNOSIS — M5441 Lumbago with sciatica, right side: Secondary | ICD-10-CM

## 2018-02-07 DIAGNOSIS — S060X0A Concussion without loss of consciousness, initial encounter: Secondary | ICD-10-CM | POA: Diagnosis not present
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
## 2018-02-12 ENCOUNTER — Encounter: Payer: Self-pay | Admitting: Family Medicine

## 2018-02-12 ENCOUNTER — Ambulatory Visit: Payer: 59 | Admitting: Family Medicine

## 2018-02-12 DIAGNOSIS — S060X9A Concussion with loss of consciousness of unspecified duration, initial encounter: Secondary | ICD-10-CM | POA: Insufficient documentation

## 2018-02-12 DIAGNOSIS — S060XAA Concussion with loss of consciousness status unknown, initial encounter: Secondary | ICD-10-CM | POA: Insufficient documentation

## 2018-02-12 DIAGNOSIS — S060X0A Concussion without loss of consciousness, initial encounter: Secondary | ICD-10-CM

## 2018-02-12 NOTE — Patient Instructions (Signed)
Good to see you   am sorry this is taking some time.  Fish oil 3 grams daily  CoQ 10  daily for the headache.  Vitamin D 2000 IU daily  Try to only work up ot 4 hours daily and we will recommend from home for now.  I do want to see you again on Monday to make sure you are doing better

## 2018-02-12 NOTE — Assessment & Plan Note (Signed)
Patient does have a mild concussion.  I believe though that patient is progressing per patient symptoms over the course of time and looking at other providers information.  Discussed icing regimen necessary and over-the-counter natural supplementations.  We are going to ask patient's place of employment for possible remote axis up to 4 hours a day until patient is further evaluated on Monday.  Worsening symptoms patient will call us.  Discussed which activities to do which wants to avoid.  Follow-up again Monday.
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
## 2018-02-18 ENCOUNTER — Ambulatory Visit: Payer: 59 | Admitting: Family Medicine

## 2018-02-18 DIAGNOSIS — S060X0D Concussion without loss of consciousness, subsequent encounter: Secondary | ICD-10-CM | POA: Diagnosis not present

## 2018-02-18 NOTE — Patient Instructions (Signed)
Good to see you  4 hour day of work for the next week in the office.  Lets continue the vitamins See em again in 1 week and hopefully we will fully release you at that time.

## 2018-02-18 NOTE — Assessment & Plan Note (Signed)
Mild concussion overall.  Patient has not gone back to work even though she was cleared.  Part-time work again encouraged and can go to the office for the next week.  Continue vitamin supplementation.  Has muscle relaxer for any of the neck pain.  Discussed icing regimen.  Follow-up with me again 1 week to fully released to full duty at that time.

## 2018-02-19 ENCOUNTER — Telehealth: Payer: Self-pay | Admitting: Family

## 2018-02-19 NOTE — Telephone Encounter (Signed)
Note sent

## 2018-02-19 NOTE — Telephone Encounter (Signed)
The fax number is [phone] attn: Starwood HotelsKimberly stocker.  cb is [phone]

## 2018-02-19 NOTE — Telephone Encounter (Signed)
Spoke with patient about work. She states that she is unable to work at this time in the environment that is loud with bright lights. Said that Dr. Katrinka BlazingSmith mentioned possibly working from home. Per a verbal from Dr. Katrinka BlazingSmith, she is allowed to work at home for the 20 hours a week until we see her again. Patient will call back for fax number to her work.

## 2018-02-19 NOTE — Telephone Encounter (Signed)
Copied from CRM [id number]. Topic: Quick Communication - See Telephone Encounter >> Feb 19, 2018  9:51 AM Lorrine KinMcGee, Tonnya Garbett B, NT wrote: CRM for notification. See Telephone encounter for: 02/19/18. Patient calling and is stating that going back to work for 4 hours today (02/19/18) is really hard. The lights and the stress of the job are getting to her. States the he mentioned working from home. States that she needs to be in a quiet and darker environment. CB#: [phone]

## 2018-02-20 DIAGNOSIS — Z0279 Encounter for issue of other medical certificate: Secondary | ICD-10-CM

## 2018-02-23 NOTE — Progress Notes (Signed)
Subjective:   I, Lisa GristValerie Singh, am serving as a scribe for Dr. Antoine PrimasZachary Fransisco Messmer, DO.  Chief Complaint: Lisa Singh, DOB: 1981/06/04, is a 37 y.o. female who presents for head injury. She is feeling better. Patient feels that light and doing more activity seems to bring about a headache. She notes that she isn't as sensitive to sound. Patient denies any mentally fogginess.  Chief Complaint  Patient presents with  . Head Injury    Injury date : 02/05/2018 Visit #: 3  Previous imagine. none    Review of Systems: Pertinent items are noted in HPI.  Review of History: Past Medical History:  Past Medical History:  Diagnosis Date  . Allergy   . Anemia    Iron deficiency  . No pertinent past medical history   . UTI (lower urinary tract infection)     Past Surgical History:  has a past surgical history that includes Cesarean section. Family History: family history includes Anemia in her maternal grandmother; Cancer in her paternal grandfather and paternal grandmother; Healthy in her mother; Hypertension in her father and maternal grandmother. Social History:  reports that she has never smoked. She has never used smokeless tobacco. She reports that she drinks alcohol. She reports that she does not use drugs. Current Medications: has a current medication list which includes the following prescription(s): clobetasol ointment, cyclobenzaprine, and multivitamin. Allergies: is allergic to penicillins.  Objective:    Physical Examination Vitals:   02/25/18 0805  BP: 122/78  Pulse: 84  SpO2: 98%   General appearance: alert, appears stated age and cooperative Head: Normocephalic, without obvious abnormality, atraumatic Eyes: conjunctivae/corneas clear. PERRL, EOM's intact. Fundi benign. Sclera anicteric. Lungs: clear to auscultation bilaterally and percussion Heart: regular rate and rhythm, S1, S2 normal, no murmur, click, rub or gallop Neurologic: CN 2-12 normal.  Sensation to pain,  touch, and proprioception normal.  DTRs  normal in upper and lower extremities. No pathologic reflexes. Neg rhomberg, modified rhomberg, pronator drift, tandem gait, finger-to-nose; see post-concussion vestibular and oculomotor testing in chart Psychiatric: Oriented X3, intact recent and remote memory, judgement and insight, normal mood and affect  Concussion testing performed today:  Patient did do very well with the scat test  Did well with serial sevens, balance 30 out of 30.  Vestibular ocular testing was unremarkable.    Assessment:    No diagnosis found.  Lisa Singh presents with the following concussion subtypes. [] Cognitive [] Cervical [] Vestibular [x] Ocular [] Migraine [] Anxiety/Mood   Plan:   Action/Discussion: Reviewed diagnosis, management options, expected outcomes, and the reasons for scheduled and emergent follow-up. Questions were adequately answered. Patient expressed verbal understanding and agreement with the following plan.

## 2018-02-25 ENCOUNTER — Encounter: Payer: Self-pay | Admitting: Family Medicine

## 2018-02-25 ENCOUNTER — Ambulatory Visit: Payer: 59 | Admitting: Family Medicine

## 2018-02-25 DIAGNOSIS — S060X0D Concussion without loss of consciousness, subsequent encounter: Secondary | ICD-10-CM | POA: Diagnosis not present

## 2018-02-25 NOTE — Patient Instructions (Signed)
I am so glad you are doing better We will get you to 4 hour days this week starting tomorrow.  Then do a week of full duty the following week.  Have an appointment for the following Monday but if better before then give me a call and we can fax a full release.

## 2018-02-25 NOTE — Assessment & Plan Note (Signed)
Patient is making progress.  Start half time work for the next week and then a trial of full duty in 2 weeks.  Continue the vitamin supplementations.  Worsening symptoms patient is to call us.  Patient's only symptom that is remaining is headache.  Follow-up again 4 weeks if not completely resolved

## 2018-02-26 NOTE — Telephone Encounter (Signed)
OV notes & form faxed.

## 2018-02-26 NOTE — Telephone Encounter (Signed)
Short term disability through employer is calling, [employer], requesting to speak with someone in office. Call back [place] Fortun[place], [phone] ext [phone]. Need office notes. Will faxed release.

## 2018-03-07 ENCOUNTER — Telehealth: Payer: Self-pay

## 2018-03-07 NOTE — Telephone Encounter (Signed)
Patient has been having an increase in symptoms since Sunday. She has been going to work and feels a pressure in her head and fatigue. She has also been experiencing lightheadedness. Recommended to patient that she go in ER if she has been having increase in symptoms that are unmanageable. She has an appointment on Monday where we will make further recommendations. Patient voices understanding.
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
## 2018-03-11 ENCOUNTER — Encounter: Payer: Self-pay | Admitting: Family Medicine

## 2018-03-11 ENCOUNTER — Ambulatory Visit: Payer: 59 | Admitting: Family Medicine

## 2018-03-11 DIAGNOSIS — S060X0D Concussion without loss of consciousness, subsequent encounter: Secondary | ICD-10-CM | POA: Diagnosis not present

## 2018-03-11 MED ORDER — HYDROXYZINE HCL 10 MG PO TABS: 10.0000 mg | ORAL_TABLET | Freq: Three times a day (TID) | ORAL | 0 refills | Status: DC | PRN

## 2018-03-11 NOTE — Patient Instructions (Signed)
Good to see you  hydroxyzine. Up to 3 times a week as needed for feeling overwhelmed.  DHEA 50 mg daily for 4 weeks See me again in 3 weeks or worsening symptoms please call and we will get MRI

## 2018-03-11 NOTE — Assessment & Plan Note (Signed)
Patient surprisingly is having significant more discomfort and pain than usual.  Patient's longevity of this problem seems to be more than what is found on physical exam.  Encourage patient to continue to attempt to try full duty at work.  I do think that patient may have some mild underlying anxiety that could be contributing.  We discussed the possibility of advanced imaging of an MRI but we did not think that this would change significant medical management.  Patient given some mild hydroxyzine to have on hand we discussed the vitamin supplementations.  Patient will follow-up with me again in 2 to 3 weeks.  Spent  25 minutes with patient face-to-face and had greater than 50% of counseling including as described above in assessment and plan.

## 2018-03-14 ENCOUNTER — Telehealth: Payer: Self-pay | Admitting: Family Medicine

## 2018-03-14 NOTE — Telephone Encounter (Signed)
Copied from CRM [id number]. Topic: Quick Communication - Rx Refill/Question >> Mar 14, 2018  3:57 PM Cipriano BunkerLambe, Annette S wrote: Medication:  Pt calling about medication,  she is taking hydrOXYzine (ATARAX/VISTARIL) 10 MG tablet Right now not working.  But is needing the more aggressive medication that Dr. Katrinka BlazingSmith recommended.  She can not remember the name        Has the patient contacted their pharmacy? No. (Agent: If no, request that the patient contact the pharmacy for the refill.) (Agent: If yes, when and what did the pharmacy advise?)  Preferred Pharmacy (with phone number or street name):   [id number] [facility]- [place], [name] - [address] [name] [id number] Phone: [phone] Fax: [phone]    Agent: Please be advised that RX refills may take up to 3 business days. We ask that you follow-up with your pharmacy.

## 2018-03-16 NOTE — Telephone Encounter (Signed)
Patient calling in for something stronger for her anxiety. She says it was mentioned when she was at the last OV a more aggressive medication, but she doesn't remember the name. She says Hydroxyzine is not working for her right now.

## 2018-03-18 MED ORDER — VENLAFAXINE HCL ER 37.5 MG PO CP24: 37.5000 mg | ORAL_CAPSULE | Freq: Every day | ORAL | 1 refills | Status: DC

## 2018-03-18 NOTE — Telephone Encounter (Signed)
Start effexor which will be daily. Sent it in .

## 2018-03-19 NOTE — Telephone Encounter (Signed)
Called pt make pt aware, vmail box is full.

## 2018-04-03 ENCOUNTER — Encounter: Payer: 59 | Admitting: Family

## 2018-04-03 ENCOUNTER — Ambulatory Visit: Payer: Self-pay | Admitting: *Deleted
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
## 2018-04-04 NOTE — Telephone Encounter (Signed)
Patient has been attempting to work out and is becoming light headed and flushed. Is seeing floaters at times as well. Mentions that she works out at lunch but does eat breakfast and lunch prior to the workouts. Also notes stress at work causes tension behind her eyes. Recommended that patient perform lighter workouts until seen by Dr. Katrinka BlazingSmith. Appt on 7/23. Patient voices understanding.

## 2018-04-05 ENCOUNTER — Ambulatory Visit: Payer: 59 | Admitting: Family

## 2018-04-05 ENCOUNTER — Encounter: Payer: Self-pay | Admitting: Family

## 2018-04-05 VITALS — BP 112/60 | HR 89 | Temp 97.5°F | Ht 63.0 in | Wt 153.1 lb

## 2018-04-05 DIAGNOSIS — H43393 Other vitreous opacities, bilateral: Secondary | ICD-10-CM | POA: Diagnosis not present

## 2018-04-05 DIAGNOSIS — R29818 Other symptoms and signs involving the nervous system: Secondary | ICD-10-CM

## 2018-04-05 DIAGNOSIS — R42 Dizziness and giddiness: Secondary | ICD-10-CM

## 2018-04-05 DIAGNOSIS — M542 Cervicalgia: Secondary | ICD-10-CM | POA: Diagnosis not present
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
## 2018-04-08 ENCOUNTER — Ambulatory Visit (INDEPENDENT_AMBULATORY_CARE_PROVIDER_SITE_OTHER)
Admission: RE | Admit: 2018-04-08 | Discharge: 2018-04-08 | Disposition: A | Discharge status: Home / Self Care | Payer: 59 | Source: Ambulatory Visit | Attending: Family | Admitting: Family

## 2018-04-08 DIAGNOSIS — R29818 Other symptoms and signs involving the nervous system: Secondary | ICD-10-CM

## 2018-04-08 NOTE — Progress Notes (Signed)
[name] [address] [place], [name] [address] Phone: [phone] Subjective:    I'm seeing this patient by the request  of:    CC: Headache and posttraumatic headache follow-up  ZHY:[id number]:Subjective  Lisa Singh is a 37 y.o. female coming in with complaint of post concussive headaches. She has been having floaters and light headedness with working out. She has been working and states that she is still sensitive to overhead lighting. She was recommended to go to an eye doctor by another provider. Stress does still bring about a headache. Patient is concerned that these symptoms are ongoing and wants to know if they will last forever.  Patient denies any chest pain, shortness of breath that is associated with it.  Patient states that it only happened really the one time.  Has not been worked out as aggressive since the episode.  Patient is concerned though that she has not completely better yet.     Past Medical History:  Diagnosis Date  . Allergy   . Anemia    Iron deficiency  . No pertinent past medical history   . UTI (lower urinary tract infection)    Past Surgical History:  Procedure Laterality Date  . CESAREAN SECTION     Social History   Socioeconomic History  . Marital status: Married    Spouse name: Not on file  . Number of children: 2  . Years of education: 6014  . Highest education level: Not on file  Occupational History  . 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 Financial resource strain: Not on file  . Food insecurity:    Worry: Not on file    Inability: Not on file  . Transportation needs:    Medical: Not on file    Non-medical: Not on file  Tobacco Use  . Smoking status: Never Smoker  . Smokeless tobacco: Never Used  Substance and Sexual Activity  . Alcohol use: Yes    Comment: socially  . Drug use: No  . Sexual activity: Yes  Lifestyle  . Physical activity:    Days per week: Not on file    Minutes per session:  Not on file  . Stress: Not on file  Relationships  . Social connections:    Talks on phone: Not on file    Gets together: Not on file    Attends religious service: Not on file    Active member of club or organization: Not on file    Attends meetings of clubs or organizations: Not on file    Relationship status: Not on file  Other Topics Concern  . Not on file  Social History Narrative   Fun: Travel, anything that is spontaneous.   Denies religious beliefs that would effect health care.    Denies abuse and feels safe at home   Allergies  Allergen Reactions  . Penicillins Hives   Family History  Problem Relation Age of Onset  . Healthy Mother   . Hypertension Father   . Hypertension Maternal Grandmother   . Anemia Maternal Grandmother   . Cancer Paternal Grandmother   . Cancer Paternal Grandfather   . Anesthesia problems Neg Hx   . Hypotension Neg Hx   . Malignant hyperthermia Neg Hx   . Pseudochol deficiency Neg Hx   . Other Neg Hx      Past medical history, social, surgical and family history all reviewed in electronic medical record.  No pertanent information unless  stated regarding to the chief complaint.   Review of Systems:Review of systems updated and as accurate as of 04/09/18  No headache, visual changes, nausea, vomiting, diarrhea, constipation, dizziness, abdominal pain, skin rash, fevers, chills, night sweats, weight loss, swollen lymph nodes, body aches, joint swelling, muscle aches, chest pain, shortness of breath, mood changes.   Objective  Blood pressure 120/78, pulse 77, height 5\' 3"  (1.6 m), weight 152 lb (68.9 kg), SpO2 98 %, unknown if currently breastfeeding. Systems examined below as of 04/09/18   General: No apparent distress alert and oriented x3 mood and affect normal, dressed appropriately.  HEENT: Pupils equal, extraocular movements intact  Respiratory: Patient's speak in full sentences and does not appear short of breath  Cardiovascular: No  lower extremity edema, non tender, no erythema  Skin: Warm dry intact with no signs of infection or rash on extremities or on axial skeleton.  Abdomen: Soft nontender  Neuro: Cranial nerves II through XII are intact, neurovascularly intact in all extremities with 2+ DTRs and 2+ pulses.  Lymph: No lymphadenopathy of posterior or anterior cervical chain or axillae bilaterally.  Gait normal with good balance and coordination.  MSK:  Non tender with full range of motion and good stability and symmetric strength and tone of shoulders, elbows, wrist, hip, knee and ankles bilaterally.  Neck: Inspection unremarkable. No palpable stepoffs. Negative Spurling's maneuver. Full neck range of motion Grip strength and sensation normal in bilateral hands Strength good C4 to T1 distribution No sensory change to C4 to T1 Negative Hoffman sign bilaterally Reflexes normal    Impression and Recommendations:     This case required medical decision making of moderate complexity.      Note: This dictation was prepared with Dragon dictation along with smaller phrase technology. Any transcriptional errors that result from this process are unintentional.

## 2018-04-09 ENCOUNTER — Other Ambulatory Visit (INDEPENDENT_AMBULATORY_CARE_PROVIDER_SITE_OTHER): Payer: 59

## 2018-04-09 ENCOUNTER — Ambulatory Visit: Payer: 59 | Admitting: Family Medicine

## 2018-04-09 ENCOUNTER — Encounter: Payer: Self-pay | Admitting: Family Medicine

## 2018-04-09 VITALS — BP 120/78 | HR 77 | Ht 63.0 in | Wt 152.0 lb

## 2018-04-09 DIAGNOSIS — M2550 Pain in unspecified joint: Secondary | ICD-10-CM

## 2018-04-09 DIAGNOSIS — S060X0D Concussion without loss of consciousness, subsequent encounter: Secondary | ICD-10-CM | POA: Diagnosis not present

## 2018-04-09 LAB — COMPREHENSIVE METABOLIC PANEL WITH GFR
ALT: 11 U/L (ref 0–35)
AST: 11 U/L (ref 0–37)
Albumin: 4.1 g/dL (ref 3.5–5.2)
Alkaline Phosphatase: 51 U/L (ref 39–117)
BUN: 9 mg/dL (ref 6–23)
CO2: 27 meq/L (ref 19–32)
Calcium: 8.9 mg/dL (ref 8.4–10.5)
Chloride: 104 meq/L (ref 96–112)
Creatinine, Ser: 0.84 mg/dL (ref 0.40–1.20)
GFR: 98.28 mL/min
Glucose, Bld: 95 mg/dL (ref 70–99)
Potassium: 3.9 meq/L (ref 3.5–5.1)
Sodium: 138 meq/L (ref 135–145)
Total Bilirubin: 0.4 mg/dL (ref 0.2–1.2)
Total Protein: 8.1 g/dL (ref 6.0–8.3)

## 2018-04-09 LAB — VITAMIN D 25 HYDROXY (VIT D DEFICIENCY, FRACTURES): VITD: 23.21 ng/mL — ABNORMAL LOW (ref 30.00–100.00)

## 2018-04-09 LAB — CBC WITH DIFFERENTIAL/PLATELET
Basophils Absolute: 0 10*3/uL (ref 0.0–0.1)
Basophils Relative: 0.2 % (ref 0.0–3.0)
EOS PCT: 1.7 % (ref 0.0–5.0)
Eosinophils Absolute: 0.1 10*3/uL (ref 0.0–0.7)
HCT: 33.7 % — ABNORMAL LOW (ref 36.0–46.0)
HEMOGLOBIN: 10.6 g/dL — AB (ref 12.0–15.0)
LYMPHS ABS: 1.4 10*3/uL (ref 0.7–4.0)
Lymphocytes Relative: 30.3 % (ref 12.0–46.0)
MCHC: 31.4 g/dL (ref 30.0–36.0)
MCV: 71.1 fl — AB (ref 78.0–100.0)
MONO ABS: 0.3 10*3/uL (ref 0.1–1.0)
Monocytes Relative: 6.8 % (ref 3.0–12.0)
Neutro Abs: 2.8 10*3/uL (ref 1.4–7.7)
Neutrophils Relative %: 61 % (ref 43.0–77.0)
Platelets: 278 10*3/uL (ref 150.0–400.0)
RBC: 4.74 Mil/uL (ref 3.87–5.11)
RDW: 14 % (ref 11.5–15.5)
WBC: 4.7 10*3/uL (ref 4.0–10.5)

## 2018-04-09 LAB — URIC ACID: Uric Acid, Serum: 3.8 mg/dL (ref 2.4–7.0)

## 2018-04-09 LAB — SEDIMENTATION RATE: Sed Rate: 76 mm/h — ABNORMAL HIGH (ref 0–20)

## 2018-04-09 LAB — C-REACTIVE PROTEIN: CRP: 1.4 mg/dL (ref 0.5–20.0)

## 2018-04-09 LAB — IBC PANEL
Iron: 35 ug/dL — ABNORMAL LOW (ref 42–145)
Saturation Ratios: 10.1 % — ABNORMAL LOW (ref 20.0–50.0)
Transferrin: 247 mg/dL (ref 212.0–360.0)

## 2018-04-09 LAB — TSH: TSH: 1.51 u[IU]/mL (ref 0.35–4.50)
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
## 2018-04-09 NOTE — Patient Instructions (Signed)
Good to see you  Lets get labs I would also have your eyes checked.  If labs come back normal then we may need the MRI  Ok to workout but eat within 20 minutes of working out.  Lets get the labs and see what goes on before we make follow up

## 2018-04-12 LAB — ANA: Anti Nuclear Antibody(ANA): NEGATIVE

## 2018-04-12 LAB — RHEUMATOID FACTOR: Rheumatoid fact SerPl-aCnc: <14 IU/mL (ref <14)

## 2018-04-12 LAB — PTH, INTACT AND CALCIUM
Calcium: 9.1 mg/dL (ref 8.6–10.2)
PTH: 118 pg/mL — ABNORMAL HIGH (ref 14–64)

## 2018-04-12 LAB — CALCIUM, IONIZED: CALCIUM ION: 5.04 mg/dL (ref 4.8–5.6)

## 2018-04-12 LAB — ANGIOTENSIN CONVERTING ENZYME: ANGIOTENSIN-CONVERTING ENZYME: 22 U/L (ref 9–67)

## 2018-04-12 LAB — CYCLIC CITRUL PEPTIDE ANTIBODY, IGG

## 2018-04-23 ENCOUNTER — Encounter: Payer: Self-pay | Admitting: Physical Therapy

## 2018-04-23 ENCOUNTER — Ambulatory Visit: Payer: 59 | Attending: Family | Admitting: Physical Therapy

## 2018-04-23 DIAGNOSIS — R252 Cramp and spasm: Secondary | ICD-10-CM

## 2018-04-23 DIAGNOSIS — M542 Cervicalgia: Secondary | ICD-10-CM
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
## 2018-04-25 ENCOUNTER — Ambulatory Visit: Payer: 59 | Admitting: Physical Therapy

## 2018-05-08 ENCOUNTER — Telehealth: Payer: Self-pay | Admitting: Family

## 2018-05-08 NOTE — Telephone Encounter (Signed)
Copied from CRM [id number]. Topic: Quick Communication - See Telephone Encounter >> May 08, 2018  4:12 PM Arlyss Gandyichardson, Kainon Varady N, NT wrote: CRM for notification. See Telephone encounter for: 05/08/18. Pt would like to be scheduled for a MRI. She states Dr. Katrinka BlazingSmith mentioned if any pain in her head lingered he would get her an order for one. She would like to proceed.

## 2018-05-09 ENCOUNTER — Encounter: Payer: Self-pay | Admitting: Physical Therapy

## 2018-05-09 ENCOUNTER — Other Ambulatory Visit: Payer: Self-pay

## 2018-05-09 ENCOUNTER — Ambulatory Visit: Payer: 59 | Admitting: Physical Therapy

## 2018-05-09 DIAGNOSIS — M542 Cervicalgia: Secondary | ICD-10-CM | POA: Diagnosis not present

## 2018-05-09 DIAGNOSIS — R252 Cramp and spasm: Secondary | ICD-10-CM

## 2018-05-09 DIAGNOSIS — R519 Headache, unspecified: Secondary | ICD-10-CM

## 2018-05-09 DIAGNOSIS — R51 Headache: Principal | ICD-10-CM

## 2018-05-09 NOTE — Patient Instructions (Signed)

## 2018-05-09 NOTE — Therapy (Signed)
[name], [name], [address] Phone: [phone]   Fax:  [phone]  Physical Therapy Treatment  Patient Details  Name: Lisa Singh MRN: [id number] Date of Birth: 02-04-1981 Referring Provider: Michiel SitesZ. Smith   Encounter Date: 05/09/2018  PT End of Session - 05/09/18 0833    Visit Number  2    Date for PT Re-Evaluation  06/23/18    PT Start Time  0805    PT Stop Time  0900    PT Time Calculation (min)  55 min       Past Medical History:  Diagnosis Date  . Allergy   . Anemia    Iron deficiency  . No pertinent past medical history   . UTI (lower urinary tract infection)     Past Surgical History:  Procedure Laterality Date  . CESAREAN SECTION      There were no vitals filed for this visit.  Subjective Assessment - 05/09/18 0804    Subjective  doing okay, yesterday was bad, but I got a ,massage and it was good    Currently in Pain?  Yes    Pain Score  4     Pain Location  Neck                       [facility] Adult PT Treatment/Exercise - 05/09/18 0001      Exercises   Exercises  Neck;Shoulder      Neck Exercises: Machines for Strengthening   UBE (Upper Arm Bike)  L 2 2 fwd/2back      Neck Exercises: Theraband   Scapula Retraction  10 reps;Red    Shoulder Extension  10 reps;Red    Shoulder ADduction  10 reps;Red    Shoulder External Rotation  10 reps;Red      Neck Exercises: Standing   Neck Retraction  10 reps;3 secs      Moist Heat Therapy   Number Minutes Moist Heat  15 Minutes    Moist Heat Location  Cervical;Shoulder      Electrical Stimulation   Electrical Stimulation Location  C/T area    Electrical Stimulation Action  IFC    Electrical Stimulation Parameters  seated    Electrical Stimulation Goals  Pain      Manual Therapy   Manual Therapy  Soft tissue mobilization    Manual therapy comments  very tight and tender    Soft tissue mobilization  BIL  cerv,trap and rhom       Trigger Point Dry Needling - 05/09/18 0844    Consent Given?  Yes    Education Handout Provided  Yes    Muscles Treated Upper Body  Upper trapezius    Upper Trapezius Response  Palpable increased muscle length;Twitch reponse elicited             PT Short Term Goals - 04/23/18 1441      PT SHORT TERM GOAL #1   Title  independent with initial HEP    Time  2    Period  Weeks    Status  New        PT Long Term Goals - 04/23/18 1441      PT LONG TERM GOAL #1   Title  understand proper posture and body mechanics    Time  8    Period  Weeks    Status  New      PT LONG TERM  GOAL #2   Title  decrease pain 50%    Time  8    Period  Weeks    Status  New      PT LONG TERM GOAL #3   Title  increase cervical ROM to WNL's    Time  8    Period  Weeks    Status  New      PT LONG TERM GOAL #4   Title  report 50% decrease in HA    Time  8    Period  Weeks    Status  New      PT LONG TERM GOAL #5   Title  return to gym    Time  8    Period  Weeks    Status  New            Plan - 05/09/18 6213    Clinical Impression Statement  pt guarded with pain and cuing needed to relax traps with ex. ptwas very tight with STW so added DN in today.    PT Treatment/Interventions  ADLs/Self Care Home Management;Cryotherapy;Electrical Stimulation;Ultrasound;Moist Heat;Therapeutic exercise;Therapeutic activities;Traction;Patient/family education;Manual techniques;Dry needling    PT Next Visit Plan  assess today sesion including DN and progress       Patient will benefit from skilled therapeutic intervention in order to improve the following deficits and impairments:  Decreased range of motion, Increased muscle spasms, Dizziness, Decreased activity tolerance, Impaired vision/preception, Pain, Impaired flexibility, Improper body mechanics, Postural dysfunction, Decreased strength  Visit Diagnosis: Cervicalgia  Cramp and spasm     Problem  List Patient Active Problem List   Diagnosis Date Noted  . Mild concussion 02/12/2018  . 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 Right wrist pain 12/13/2017  . Contusion 12/29/2016  . Encounter for routine adult medical exam with abnormal findings 04/04/2016  . Benign skin lesion of lower back 04/04/2016  . AP (abdominal pain) 05/20/2015  . Flank pain 05/20/2015  . Abnormal stools 04/12/2015  . Left flank pain, chronic 04/06/2015  . CN (constipation) 04/06/2015  . Anemia 03/11/2015  . Seasonal allergies 03/11/2015  . Mid back pain 03/11/2015    Jearld Lesch., PT 05/09/2018, 8:46 AM  [facility] [address] [place], [place], [address] Phone: [phone]   Fax:  [phone]  [name] Date of Birth: 1980-10-06

## 2018-05-29 ENCOUNTER — Ambulatory Visit: Payer: 59 | Admitting: Physical Therapy

## 2018-05-30 ENCOUNTER — Ambulatory Visit: Payer: 59 | Attending: Family | Admitting: Physical Therapy

## 2018-05-30 ENCOUNTER — Encounter: Payer: Self-pay | Admitting: Physical Therapy

## 2018-05-30 DIAGNOSIS — M542 Cervicalgia: Secondary | ICD-10-CM | POA: Insufficient documentation

## 2018-05-30 DIAGNOSIS — R252 Cramp and spasm: Secondary | ICD-10-CM | POA: Insufficient documentation

## 2018-05-30 NOTE — Therapy (Signed)
[place], [place], [address] Phone: [phone]   Fax:  [phone]  Physical Therapy Treatment  Patient Details  Name: Lisa Singh MRN: [id number] Date of Birth: 04/05/1981 Referring Provider: Creig Hines   Encounter Date: 05/30/2018  PT End of Session - 05/30/18 0843    Visit Number  3    Date for PT Re-Evaluation  06/23/18    PT Start Time  0802    PT Stop Time  2992    PT Time Calculation (min)  55 min    Activity Tolerance  Patient tolerated treatment well    Behavior During Therapy  [facility] for tasks assessed/performed       Past Medical History:  Diagnosis Date  . Allergy   . Anemia    Iron deficiency  . No pertinent past medical history   . UTI (lower urinary tract infection)     Past Surgical History:  Procedure Laterality Date  . CESAREAN SECTION      There were no vitals filed for this visit.  Subjective Assessment - 05/30/18 0803    Subjective  "It is doing better"    Currently in Pain?  Yes    Pain Score  6     Pain Location  Back    Pain Orientation  Upper    Pain Descriptors / Indicators  Tightness;Dull                       [facility] Adult PT Treatment/Exercise - 05/30/18 0001      Exercises   Exercises  Neck;Shoulder      Neck Exercises: Machines for Strengthening   UBE (Upper Arm Bike)  L 2  38fd/3back      Neck Exercises: Theraband   Scapula Retraction  10 reps;Red    Shoulder Extension  5 reps;Red    Shoulder External Rotation  10 reps;Red      Moist Heat Therapy   Number Minutes Moist Heat  15 Minutes    Moist Heat Location  Cervical;Shoulder      Electrical Stimulation   Electrical Stimulation Location  C/T area    Electrical Stimulation Action  IFC    Electrical Stimulation Parameters  seated    Electrical Stimulation Goals  Pain      Manual Therapy   Manual Therapy  Soft tissue mobilization    Manual therapy comments  very  tight and tender    Soft tissue mobilization  BIL cerv,trap and rhom               PT Short Term Goals - 05/30/18 0843      PT SHORT TERM GOAL #1   Title  independent with initial HEP    Status  Achieved        PT Long Term Goals - 05/30/18 0844      PT LONG TERM GOAL #1   Title  understand proper posture and body mechanics    Status  On-going      PT LONG TERM GOAL #2   Title  decrease pain 50%    Status  Partially Met      PT LONG TERM GOAL #3   Title  increase cervical ROM to WNL's            Plan - 05/30/18 0844    Clinical Impression Statement  Pt reports some improvement with decrease tension in her neck. She was able  to do all of the exercises  but reports increase tension with shoulder extension. Positive response to MT    Rehab Potential  Good    PT Frequency  2x / week    PT Duration  8 weeks    PT Treatment/Interventions  ADLs/Self Care Home Management;Cryotherapy;Electrical Stimulation;Ultrasound;Moist Heat;Therapeutic exercise;Therapeutic activities;Traction;Patient/family education;Manual techniques;Dry needling    PT Next Visit Plan  assess today sesion possible DN       Patient will benefit from skilled therapeutic intervention in order to improve the following deficits and impairments:  Decreased range of motion, Increased muscle spasms, Dizziness, Decreased activity tolerance, Impaired vision/preception, Pain, Impaired flexibility, Improper body mechanics, Postural dysfunction, Decreased strength  Visit Diagnosis: Cervicalgia  Cramp and spasm     Problem List Patient Active Problem List   Diagnosis Date Noted  . Mild concussion 02/12/2018  . 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 Right wrist pain 12/13/2017  . Contusion 12/29/2016  . Encounter for routine adult medical exam with abnormal findings 04/04/2016  . Benign skin lesion of lower back 04/04/2016  . AP (abdominal pain) 05/20/2015  . Flank pain 05/20/2015  .  Abnormal stools 04/12/2015  . Left flank pain, chronic 04/06/2015  . CN (constipation) 04/06/2015  . Anemia 03/11/2015  . Seasonal allergies 03/11/2015  . Mid back pain 03/11/2015    Scot Jun, PTA 05/30/2018, 8:47 AM  [place], [place], [address] Phone: [phone]   Fax:  [phone]  Name: Cybill Uriegas MRN: [id number] Date of Birth: [birth year]

## 2018-05-31 ENCOUNTER — Ambulatory Visit: Payer: 59 | Admitting: Physical Therapy

## 2018-05-31 ENCOUNTER — Encounter: Payer: Self-pay | Admitting: Physical Therapy

## 2018-05-31 DIAGNOSIS — R252 Cramp and spasm: Secondary | ICD-10-CM

## 2018-05-31 DIAGNOSIS — M542 Cervicalgia: Secondary | ICD-10-CM

## 2018-05-31 NOTE — Therapy (Addendum)
[place], [place], [address] Phone: [phone]   Fax:  [phone]  Physical Therapy Treatment  Patient Details  Name: Lisa Singh MRN: [id number] Date of Birth: [birth year] Referring Provider: Creig Hines   Encounter Date: 05/31/2018  PT End of Session - 05/31/18 0841    Visit Number  4    Date for PT Re-Evaluation  06/23/18    PT Start Time  0800    PT Stop Time  0855    PT Time Calculation (min)  55 min    Activity Tolerance  Patient tolerated treatment well    Behavior During Therapy  [facility] for tasks assessed/performed       Past Medical History:  Diagnosis Date  . Allergy   . Anemia    Iron deficiency  . No pertinent past medical history   . UTI (lower urinary tract infection)     Past Surgical History:  Procedure Laterality Date  . CESAREAN SECTION      There were no vitals filed for this visit.  Subjective Assessment - 05/31/18 0801    Subjective  "Feeling better" Some tightness on R side, pt also reports some pain yesterday but it did not stay.    Currently in Pain?  --   "No really"                      [facility] Adult PT Treatment/Exercise - 05/31/18 0001      Exercises   Exercises  Neck;Shoulder      Neck Exercises: Machines for Strengthening   UBE (Upper Arm Bike)  L 1  74fd/3back      Shoulder Exercises: Standing   External Rotation  20 reps;Theraband;Both    Theraband Level (Shoulder External Rotation)  Level 1 (Yellow)    Flexion  10 reps;Weights;Both    Shoulder Flexion Weight (lbs)  1    ABduction  Weights;10 reps;Right    Shoulder ABduction Weight (lbs)  1    Extension  Theraband;20 reps;Both    Theraband Level (Shoulder Extension)  Level 1 (Yellow)    Row  Theraband;20 reps;Both;Strengthening    Theraband Level (Shoulder Row)  Level 3 (Green)      Shoulder Exercises: ROM/Strengthening   Other ROM/Strengthening Exercises  Rows & lats 15lb  2x10       Moist Heat Therapy   Number Minutes Moist Heat  15 Minutes    Moist Heat Location  Cervical;Shoulder      Electrical Stimulation   Electrical Stimulation Location  C/T area    Electrical Stimulation Action  IFC    Electrical Stimulation Parameters  seated    Electrical Stimulation Goals  Pain      Manual Therapy   Manual Therapy  Soft tissue mobilization    Manual therapy comments  very tight and tender    Soft tissue mobilization  BIL cerv,trap and rhom               PT Short Term Goals - 05/30/18 0843      PT SHORT TERM GOAL #1   Title  independent with initial HEP    Status  Achieved        PT Long Term Goals - 05/31/18 0842      PT LONG TERM GOAL #1   Title  understand proper posture and body mechanics    Status  Achieved      PT LONG TERM GOAL #  2   Title  decrease pain 50%    Status  Partially Met      PT LONG TERM GOAL #4   Title  report 50% decrease in HA    Status  Partially Met      PT LONG TERM GOAL #5   Title  return to gym    Status  On-going            Plan - 05/31/18 2353    Clinical Impression Statement  Pt continues to report improvement and progression towards goals. She reports decrease pain with increase cervical and scapular mobility. Postural cues during postural strengthening exercises. No noted increase in tightness reported with shoulder extensions.  She continues to respond positive to MT.    Rehab Potential  Good    PT Next Visit Plan  postural strengthening.       Patient will benefit from skilled therapeutic intervention in order to improve the following deficits and impairments:  Decreased range of motion, Increased muscle spasms, Dizziness, Decreased activity tolerance, Impaired vision/preception, Pain, Impaired flexibility, Improper body mechanics, Postural dysfunction, Decreased strength  Visit Diagnosis: Cramp and spasm  Cervicalgia     Problem List Patient Active Problem List   Diagnosis Date  Noted  . Mild concussion 02/12/2018  . 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 Right wrist pain 12/13/2017  . Contusion 12/29/2016  . Encounter for routine adult medical exam with abnormal findings 04/04/2016  . Benign skin lesion of lower back 04/04/2016  . AP (abdominal pain) 05/20/2015  . Flank pain 05/20/2015  . Abnormal stools 04/12/2015  . Left flank pain, chronic 04/06/2015  . CN (constipation) 04/06/2015  . Anemia 03/11/2015  . Seasonal allergies 03/11/2015  . Mid back pain 03/11/2015   PHYSICAL THERAPY DISCHARGE SUMMARY  Visits from Start of Care: 4 Plan: Patient agrees to discharge.  Patient goals were partially met. Patient is being discharged due to being pleased with the current functional level.  ?????      Scot Jun, PTA 05/31/2018, 8:44 AM  [place], [place], [address] Phone: [phone]   Fax:  [phone]  Name: Jeanelle Dake MRN: [id number] Date of Birth: [birth year]

## 2018-06-01 ENCOUNTER — Other Ambulatory Visit: Payer: 59

## 2018-06-06 ENCOUNTER — Ambulatory Visit: Payer: 59 | Admitting: Physical Therapy

## 2019-03-20 ENCOUNTER — Other Ambulatory Visit: Payer: Self-pay | Admitting: Obstetrics and Gynecology

## 2019-03-20 DIAGNOSIS — N6320 Unspecified lump in the left breast, unspecified quadrant: Secondary | ICD-10-CM

## 2019-03-25 ENCOUNTER — Encounter: Payer: 59 | Admitting: Family

## 2019-03-25 DIAGNOSIS — Z0289 Encounter for other administrative examinations: Secondary | ICD-10-CM

## 2019-03-27 ENCOUNTER — Other Ambulatory Visit: Payer: Self-pay

## 2019-03-27 ENCOUNTER — Ambulatory Visit
Admission: RE | Admit: 2019-03-27 | Discharge: 2019-03-27 | Disposition: A | Discharge status: Home / Self Care | Payer: 59 | Source: Ambulatory Visit | Attending: Obstetrics and Gynecology | Admitting: Obstetrics and Gynecology

## 2019-03-27 DIAGNOSIS — N6320 Unspecified lump in the left breast, unspecified quadrant: Secondary | ICD-10-CM

## 2020-01-02 ENCOUNTER — Other Ambulatory Visit: Payer: Self-pay

## 2020-01-02 ENCOUNTER — Encounter: Payer: Self-pay | Admitting: Family

## 2020-01-02 ENCOUNTER — Ambulatory Visit (INDEPENDENT_AMBULATORY_CARE_PROVIDER_SITE_OTHER): Payer: 59 | Admitting: Family

## 2020-01-02 VITALS — BP 122/70 | HR 68 | Temp 98.2°F | Ht 63.0 in | Wt 159.0 lb

## 2020-01-02 DIAGNOSIS — R590 Localized enlarged lymph nodes: Secondary | ICD-10-CM

## 2020-01-02 DIAGNOSIS — Z13220 Encounter for screening for lipoid disorders: Secondary | ICD-10-CM | POA: Diagnosis not present

## 2020-01-02 DIAGNOSIS — Z1321 Encounter for screening for nutritional disorder: Secondary | ICD-10-CM

## 2020-01-02 DIAGNOSIS — Z0000 Encounter for general adult medical examination without abnormal findings: Secondary | ICD-10-CM

## 2020-01-02 LAB — COMPREHENSIVE METABOLIC PANEL
ALT: 21 U/L (ref 0–35)
AST: 17 U/L (ref 0–37)
Albumin: 4.1 g/dL (ref 3.5–5.2)
Alkaline Phosphatase: 52 U/L (ref 39–117)
BUN: 7 mg/dL (ref 6–23)
CO2: 27 mEq/L (ref 19–32)
Calcium: 9.4 mg/dL (ref 8.4–10.5)
Chloride: 105 mEq/L (ref 96–112)
Creatinine, Ser: 0.82 mg/dL (ref 0.40–1.20)
GFR: 94.19 mL/min (ref >60.00)
Glucose, Bld: 96 mg/dL (ref 70–99)
Potassium: 4 mEq/L (ref 3.5–5.1)
Sodium: 138 mEq/L (ref 135–145)
Total Bilirubin: 0.5 mg/dL (ref 0.2–1.2)
Total Protein: 7.6 g/dL (ref 6.0–8.3)

## 2020-01-02 LAB — CBC WITH DIFFERENTIAL/PLATELET
Basophils Absolute: 0 10*3/uL (ref 0.0–0.1)
Basophils Relative: 1 % (ref 0.0–3.0)
Eosinophils Absolute: 0.1 10*3/uL (ref 0.0–0.7)
Eosinophils Relative: 1.8 % (ref 0.0–5.0)
HCT: 34.8 % — ABNORMAL LOW (ref 36.0–46.0)
Hemoglobin: 10.9 g/dL — ABNORMAL LOW (ref 12.0–15.0)
Lymphocytes Relative: 35.4 % (ref 12.0–46.0)
Lymphs Abs: 1.5 10*3/uL (ref 0.7–4.0)
MCHC: 31.3 g/dL (ref 30.0–36.0)
MCV: 71.8 fl — ABNORMAL LOW (ref 78.0–100.0)
Monocytes Absolute: 0.3 10*3/uL (ref 0.1–1.0)
Monocytes Relative: 5.9 % (ref 3.0–12.0)
Neutro Abs: 2.4 10*3/uL (ref 1.4–7.7)
Neutrophils Relative %: 55.9 % (ref 43.0–77.0)
Platelets: 254 10*3/uL (ref 150.0–400.0)
RBC: 4.85 Mil/uL (ref 3.87–5.11)
RDW: 14.1 % (ref 11.5–15.5)
WBC: 4.3 10*3/uL (ref 4.0–10.5)

## 2020-01-02 LAB — LIPID PANEL
Cholesterol: 152 mg/dL (ref 0–200)
HDL: 40.6 mg/dL (ref >39.00)
LDL Cholesterol: 103 mg/dL — ABNORMAL HIGH (ref 0–99)
NonHDL: 111.49
Total CHOL/HDL Ratio: 4
Triglycerides: 43 mg/dL (ref 0.0–149.0)
VLDL: 8.6 mg/dL (ref 0.0–40.0)

## 2020-01-02 LAB — VITAMIN D 25 HYDROXY (VIT D DEFICIENCY, FRACTURES): VITD: 29.7 ng/mL — ABNORMAL LOW (ref 30.00–100.00)

## 2020-01-02 LAB — TSH: TSH: 2.16 u[IU]/mL (ref 0.35–4.50)

## 2020-01-02 MED ORDER — DOXYCYCLINE HYCLATE 100 MG PO TABS: 100.0000 mg | ORAL_TABLET | Freq: Two times a day (BID) | ORAL | 0 refills | Status: DC

## 2020-01-02 NOTE — Progress Notes (Signed)
Lisa Singh is a 39 y.o. female with the following history as recorded in [employer]:  Patient Active Problem List   Diagnosis Date Noted  . Mild concussion 02/12/2018  . 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 Right wrist pain 12/13/2017  . Contusion 12/29/2016  . Encounter for routine adult medical exam with abnormal findings 04/04/2016  . Benign skin lesion of lower back 04/04/2016  . AP (abdominal pain) 05/20/2015  . Flank pain 05/20/2015  . Abnormal stools 04/12/2015  . Left flank pain, chronic 04/06/2015  . CN (constipation) 04/06/2015  . Anemia 03/11/2015  . Seasonal allergies 03/11/2015  . Mid back pain 03/11/2015    Current Outpatient Medications  Medication Sig Dispense Refill  . clobetasol ointment (TEMOVATE) 1.61 % Apply 1 application topically 2 (two) times daily. 30 g 0  . Multiple Vitamin (MULTIVITAMIN) tablet Take 1 tablet by mouth daily.    Marland Kitchen doxycycline (VIBRA-TABS) 100 MG tablet Take 1 tablet (100 mg total) by mouth 2 (two) times daily. 20 tablet 0   No current facility-administered medications for this visit.    Allergies: Penicillins  Past Medical History:  Diagnosis Date  . Allergy   . Anemia    Iron deficiency  . No pertinent past medical history   . UTI (lower urinary tract infection)     Past Surgical History:  Procedure Laterality Date  . CESAREAN SECTION      Family History  Problem Relation Age of Onset  . Healthy Mother   . Hypertension Father   . Hypertension Maternal Grandmother   . Anemia Maternal Grandmother   . Cancer Paternal Grandmother   . Cancer Paternal Grandfather   . Anesthesia problems Neg Hx   . Hypotension Neg Hx   . Malignant hyperthermia Neg Hx   . Pseudochol deficiency Neg Hx   . Other Neg Hx     Social History   Tobacco Use  . Smoking status: Never Smoker  . Smokeless tobacco: Never Used  Substance Use Topics  . Alcohol use: Yes    Comment: socially    Subjective:  Presents for yearly CPE; has  not had CPE here since 2017; Per patient, she does have a GYN- saw them in 2020/ does not remember when her pap smear was done; Overdue for dental and vision exams;  Exercises 4-5 days/ week; working with trainer; admits has not been exercising as regularly recently and knows she needs to get back on track  Also concerned about "swollen lymph nodes" in both of her armpits; seemed to worsen with her cycle; has been using a different type of deodorant recently; no breast pain or breast rash or nipple discharge;     LMP- 01/01/2020; not sexually active Does not want to do any type of vaccine   Review of Systems  Constitutional: Negative.   HENT: Negative.   Eyes: Negative.   Respiratory: Negative.   Cardiovascular: Negative.   Gastrointestinal: Negative.   Genitourinary: Negative.   Musculoskeletal: Negative.   Skin: Negative.   Neurological: Negative.   Endo/Heme/Allergies: Negative.   Psychiatric/Behavioral: Negative.      Objective:  Vitals:   01/02/20 0903  BP: 122/70  Pulse: 68  Temp: 98.2 F (36.8 C)  TempSrc: Oral  SpO2: 99%  Weight: 159 lb (72.1 kg)  Height: '5\' 3"'$  (1.6 m)    General: Well developed, well nourished, in no acute distress  Skin : Warm and dry.  Head: Normocephalic and atraumatic  Eyes: Sclera and conjunctiva clear; pupils  round and reactive to light; extraocular movements intact  Ears: External normal; canals clear; tympanic membranes normal  Oropharynx: Pink, supple. No suspicious lesions  Neck: Supple without thyromegaly, adenopathy  Lungs: Respirations unlabored; clear to auscultation bilaterally without wheeze, rales, rhonchi  [facility] exam: normal rate and regular rhythm.  Abdomen: Soft; nontender; nondistended; normoactive bowel sounds; no masses or hepatosplenomegaly  Musculoskeletal: No deformities; no active joint inflammation  Extremities: No edema, cyanosis, clubbing  Vessels: Symmetric bilaterally  Neurologic: Alert and oriented; speech  intact; face symmetrical; moves all extremities well; CNII-XII intact without focal deficit  Breast exam- normal bilaterally; folliculitis noted in axillary region bilaterally  Assessment:  1. PE (physical exam), annual   2. Axillary lymphadenopathy   3. Lipid screening   4. Encounter for vitamin deficiency screening     Plan:  Age appropriate preventive healthcare needs addressed; encouraged regular eye doctor and dental exams; encouraged regular exercise and weight loss; see GYN as scheduled; will update labs and refills as needed today; follow-up to be determined;  Rx for Doxycycline 100 mg bid to treat hidradenitis; encouraged to apply warm compresses; will update bilateral diagnostic mammogram; follow-up to be determined.  This visit occurred during the SARS-CoV-2 public health emergency.  Safety protocols were in place, including screening questions prior to the visit, additional usage of staff PPE, and extensive cleaning of exam room while observing appropriate contact time as indicated for disinfecting solutions.     No follow-ups on file.  Orders Placed This Encounter  Procedures  . MM Digital Diagnostic Bilat    Standing Status:   Future    Standing Expiration Date:   03/03/2021    Order Specific Question:   Reason for Exam (SYMPTOM  OR DIAGNOSIS REQUIRED)    Answer:   bilateral lymphadenopathy    Order Specific Question:   Is the patient pregnant?    Answer:   No    Order Specific Question:   Preferred imaging location?    Answer:   [facility]  . CBC with Differential/Platelet  . Comp Met (CMET)  . Lipid panel  . TSH  . Vitamin D (25 hydroxy)    Requested Prescriptions   Signed Prescriptions Disp Refills  . doxycycline (VIBRA-TABS) 100 MG tablet 20 tablet 0    Sig: Take 1 tablet (100 mg total) by mouth 2 (two) times daily.

## 2020-01-02 NOTE — Patient Instructions (Addendum)
Hidradenitis Suppurativa Hidradenitis suppurativa is a long-term (chronic) skin disease. It is similar to a severe form of acne, but it affects areas of the body where acne would be unusual, especially areas of the body where skin rubs against skin and becomes moist. These include:  Underarms.  Groin.  Genital area.  Buttocks.  Upper thighs.  Breasts. Hidradenitis suppurativa may start out as small lumps or pimples caused by blocked sweat glands or hair follicles. Pimples may develop into deep sores that break open (rupture) and drain pus. Over time, affected areas of skin may thicken and become scarred. This condition is rare and does not spread from person to person (non-contagious). What are the causes? The exact cause of this condition is not known. It may be related to:  Female and female hormones.  An overactive disease-fighting system (immune system). The immune system may over-react to blocked hair follicles or sweat glands and cause swelling and pus-filled sores. What increases the risk? You are more likely to develop this condition if you:  Are female.  Are 60-37 years old.  Have a family history of hidradenitis suppurativa.  Have a personal history of acne.  Are overweight.  Smoke.  Take the medicine lithium. What are the signs or symptoms? The first symptoms are usually painful bumps in the skin, similar to pimples. The condition may get worse over time (progress), or it may only cause mild symptoms. If the disease progresses, symptoms may include:  Skin bumps getting bigger and growing deeper into the skin.  Bumps rupturing and draining pus.  Itchy, infected skin.  Skin getting thicker and scarred.  Tunnels under the skin (fistulas) where pus drains from a bump.  Pain during daily activities, such as pain during walking if your groin area is affected.  Emotional problems, such as stress or depression. This condition may affect your appearance  and your ability or willingness to wear certain clothes or do certain activities. How is this diagnosed? This condition is diagnosed by a health care provider who specializes in skin diseases (dermatologist). You may be diagnosed based on:  Your symptoms and medical history.  A physical exam.  Testing a pus sample for infection.  Blood tests. How is this treated? Your treatment will depend on how severe your symptoms are. The same treatment will not work for everybody with this condition. You may need to try several treatments to find what works best for you. Treatment may include:  Cleaning and bandaging (dressing) your wounds as needed.  Lifestyle changes, such as new skin care routines.  Taking medicines, such as: ? Antibiotics. ? Acne medicines. ? Medicines to reduce the activity of the immune system. ? A diabetes medicine (metformin). ? Birth control pills, for women. ? Steroids to reduce swelling and pain.  Working with a mental health care provider, if you experience emotional distress due to this condition. If you have severe symptoms that do not get better with medicine, you may need surgery. Surgery may involve:  Using a laser to clear the skin and remove hair follicles.  Opening and draining deep sores.  Removing the areas of skin that are diseased and scarred. Follow these instructions at home: Medicines   Take over-the-counter and prescription medicines only as told by your health care provider.  If you were prescribed an antibiotic medicine, take it as told by your health care provider. Do not stop taking the antibiotic even if your condition improves. Skin care  If you  have open wounds, cover them with a clean dressing as told by your health care provider. Keep wounds clean by washing them gently with soap and water when you bathe.  Do not shave the areas where you get hidradenitis suppurativa.  Do not wear deodorant.  Wear loose-fitting clothes.  Try  to avoid getting overheated or sweaty. If you get sweaty or wet, change into clean, dry clothes as soon as you can.  To help relieve pain and itchiness, cover sore areas with a warm, clean washcloth (warm compress) for 5-10 minutes as often as needed.  If told by your health care provider, take a bleach bath twice a week: ? Fill your bathtub halfway with water. ? Pour in  cup of unscented household bleach. ? Soak in the tub for 5-10 minutes. ? Only soak from the neck down. Avoid water on your face and hair. ? Shower to rinse off the bleach from your skin. General instructions  Learn as much as you can about your disease so that you have an active role in your treatment. Work closely with your health care provider to find treatments that work for you.  If you are overweight, work with your health care provider to lose weight as recommended.  Do not use any products that contain nicotine or tobacco, such as cigarettes and e-cigarettes. If you need help quitting, ask your health care provider.  If you struggle with living with this condition, talk with your health care provider or work with a mental health care provider as recommended.  Keep all follow-up visits as told by your health care provider. This is important. Where to find more information  Hidradenitis Suppurativa Foundation, Inc.: https://www.hs-foundation.org/ Contact a health care provider if you have:  A flare-up of hidradenitis suppurativa.  A fever or chills.  Trouble controlling your symptoms at home.  Trouble doing your daily activities because of your symptoms.  Trouble dealing with emotional problems related to your condition. Summary  Hidradenitis suppurativa is a long-term (chronic) skin disease. It is similar to a severe form of acne, but it affects areas of the body where acne would be unusual.  The first symptoms are usually painful bumps in the skin, similar to pimples. The condition may get worse over  time (progress), or it may only cause mild symptoms.  If you have open wounds, cover them with a clean dressing as told by your health care provider. Keep wounds clean by washing them gently with soap and water when you bathe.  Besides skin care, treatment may include medicines, laser treatment, and surgery. This information is not intended to replace advice given to you by your health care provider. Make sure you discuss any questions you have with your health care provider. Document Revised: 09/12/2017 Document Reviewed: 09/12/2017 Elsevier Patient Education  2020 [employer]. Health Maintenance, Female Adopting a healthy lifestyle and getting preventive care are important in promoting health and wellness. Ask your health care provider about:  The right schedule for you to have regular tests and exams.  Things you can do on your own to prevent diseases and keep yourself healthy. What should I know about diet, weight, and exercise? Eat a healthy diet   Eat a diet that includes plenty of vegetables, fruits, low-fat dairy products, and lean protein.  Do not eat a lot of foods that are high in solid fats, added sugars, or sodium. Maintain a healthy weight Body mass index (BMI) is used to identify weight problems. It estimates  body fat based on height and weight. Your health care provider can help determine your BMI and help you achieve or maintain a healthy weight. Get regular exercise Get regular exercise. This is one of the most important things you can do for your health. Most adults should:  Exercise for at least 150 minutes each week. The exercise should increase your heart rate and make you sweat (moderate-intensity exercise).  Do strengthening exercises at least twice a week. This is in addition to the moderate-intensity exercise.  Spend less time sitting. Even light physical activity can be beneficial. Watch cholesterol and blood lipids Have your blood tested for lipids and  cholesterol at 39 years of age, then have this test every 5 years. Have your cholesterol levels checked more often if:  Your lipid or cholesterol levels are high.  You are older than 39 years of age.  You are at high risk for heart disease. What should I know about cancer screening? Depending on your health history and family history, you may need to have cancer screening at various ages. This may include screening for:  Breast cancer.  Cervical cancer.  Colorectal cancer.  Skin cancer.  Lung cancer. What should I know about heart disease, diabetes, and high blood pressure? Blood pressure and heart disease  High blood pressure causes heart disease and increases the risk of stroke. This is more likely to develop in people who have high blood pressure readings, are of African descent, or are overweight.  Have your blood pressure checked: ? Every 3-5 years if you are 22-63 years of age. ? Every year if you are 2 years old or older. Diabetes Have regular diabetes screenings. This checks your fasting blood sugar level. Have the screening done:  Once every three years after age 44 if you are at a normal weight and have a low risk for diabetes.  More often and at a younger age if you are overweight or have a high risk for diabetes. What should I know about preventing infection? Hepatitis B If you have a higher risk for hepatitis B, you should be screened for this virus. Talk with your health care provider to find out if you are at risk for hepatitis B infection. Hepatitis C Testing is recommended for:  Everyone born from [birth year] through 1965.  Anyone with known risk factors for hepatitis C. Sexually transmitted infections (STIs)  Get screened for STIs, including gonorrhea and chlamydia, if: ? You are sexually active and are younger than 39 years of age. ? You are older than 39 years of age and your health care provider tells you that you are at risk for this type of infection.  ? Your sexual activity has changed since you were last screened, and you are at increased risk for chlamydia or gonorrhea. Ask your health care provider if you are at risk.  Ask your health care provider about whether you are at high risk for HIV. Your health care provider may recommend a prescription medicine to help prevent HIV infection. If you choose to take medicine to prevent HIV, you should first get tested for HIV. You should then be tested every 3 months for as long as you are taking the medicine. Pregnancy  If you are about to stop having your period (premenopausal) and you may become pregnant, seek counseling before you get pregnant.  Take 400 to 800 micrograms (mcg) of folic acid every day if you become pregnant.  Ask for birth control (contraception) if you want  to prevent pregnancy. Osteoporosis and menopause Osteoporosis is a disease in which the bones lose minerals and strength with aging. This can result in bone fractures. If you are 65 years old or older, or if you are at risk for osteoporosis and fractures, ask your health care provider if you should:  Be screened for bone loss.  Take a calcium or vitamin D supplement to lower your risk of fractures.  Be given hormone replacement therapy (HRT) to treat symptoms of menopause. Follow these instructions at home: Lifestyle  Do not use any products that contain nicotine or tobacco, such as cigarettes, e-cigarettes, and chewing tobacco. If you need help quitting, ask your health care provider.  Do not use street drugs.  Do not share needles.  Ask your health care provider for help if you need support or information about quitting drugs. Alcohol use  Do not drink alcohol if: ? Your health care provider tells you not to drink. ? You are pregnant, may be pregnant, or are planning to become pregnant.  If you drink alcohol: ? Limit how much you use to 0-1 drink a day. ? Limit intake if you are breastfeeding.  Be aware  of how much alcohol is in your drink. In the [place] one drink equals one 12 oz bottle of beer (355 mL), one 5 oz glass of wine (148 mL), or one 1 oz glass of hard liquor (44 mL). General instructions  Schedule regular health, dental, and eye exams.  Stay current with your vaccines.  Tell your health care provider if: ? You often feel depressed. ? You have ever been abused or do not feel safe at home. Summary  Adopting a healthy lifestyle and getting preventive care are important in promoting health and wellness.  Follow your health care provider's instructions about healthy diet, exercising, and getting tested or screened for diseases.  Follow your health care provider's instructions on monitoring your cholesterol and blood pressure. This information is not intended to replace advice given to you by your health care provider. Make sure you discuss any questions you have with your health care provider. Document Revised: 08/28/2018 Document Reviewed: 08/28/2018 Elsevier Patient Education  2020 [employer].

## 2020-02-02 ENCOUNTER — Telehealth: Payer: Self-pay | Admitting: Family

## 2020-02-02 NOTE — Telephone Encounter (Signed)
New message:    Pt is calling and has some questions about the symptoms and side effects of this medication doxycycline (VIBRA-TABS) 100 MG tablet. Please advise.

## 2020-02-02 NOTE — Telephone Encounter (Signed)
New message:   Pt is calling to follow up on a response from her message earlier. Pt states she would like a call back today if possible. Please advise.

## 2020-02-03 ENCOUNTER — Other Ambulatory Visit: Payer: Self-pay | Admitting: Family

## 2020-02-03 MED ORDER — FLUCONAZOLE 150 MG PO TABS: ORAL_TABLET | ORAL | 0 refills | Status: DC
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
## 2020-03-26 ENCOUNTER — Telehealth: Payer: Self-pay | Admitting: Family

## 2020-03-26 DIAGNOSIS — R590 Localized enlarged lymph nodes: Secondary | ICD-10-CM

## 2020-03-26 NOTE — Telephone Encounter (Signed)
Pt states that she was instructed to let provider know if the axillary lymphadenopathy as not gotten better over time.  She is aware that Vernona Rieger is out of office until Tuesday & will address her concerns at that time.  Pt verb understanding.

## 2020-03-26 NOTE — Telephone Encounter (Signed)
New message:   Pt is calling and states she would now like to go have the breast scan done that was scheduled for her before but she canceled. Pt states she would also like to know if Dayton Scrape can prescribe her a medication she has been using for her ecezma. Please advise.

## 2020-03-30 NOTE — Telephone Encounter (Signed)
I will re-order the mammogram for her. What medication is she wanting for her eczema?

## 2020-03-30 NOTE — Telephone Encounter (Signed)
Tried calling pt there was no answer and can't leave msg due to vm being full.Lisa KitchenRaechel Chute

## 2020-03-31 ENCOUNTER — Other Ambulatory Visit: Payer: Self-pay | Admitting: Family

## 2020-03-31 MED ORDER — CLOBETASOL PROPIONATE 0.05 % EX OINT: 1.0000 "application " | TOPICAL_OINTMENT | Freq: Two times a day (BID) | CUTANEOUS | 0 refills | Status: DC

## 2020-03-31 NOTE — Telephone Encounter (Signed)
Notified pt w/Laura response. Pt states she is needing the Clobetasol Ointment sent to [facility]../lmb

## 2020-04-05 ENCOUNTER — Other Ambulatory Visit: Payer: Self-pay | Admitting: Family

## 2020-04-05 DIAGNOSIS — R590 Localized enlarged lymph nodes: Secondary | ICD-10-CM

## 2020-04-23 ENCOUNTER — Other Ambulatory Visit: Payer: 59

## 2020-04-26 ENCOUNTER — Ambulatory Visit
Admission: RE | Admit: 2020-04-26 | Discharge: 2020-04-26 | Disposition: A | Discharge status: Home / Self Care | Payer: 59 | Source: Ambulatory Visit | Attending: Family | Admitting: Family

## 2020-04-26 ENCOUNTER — Other Ambulatory Visit: Payer: 59

## 2020-04-26 ENCOUNTER — Other Ambulatory Visit: Payer: Self-pay

## 2020-04-26 DIAGNOSIS — R590 Localized enlarged lymph nodes: Secondary | ICD-10-CM

## 2020-08-20 ENCOUNTER — Ambulatory Visit: Payer: 59 | Admitting: Family

## 2020-08-23 ENCOUNTER — Ambulatory Visit: Payer: 59 | Admitting: Family

## 2020-08-23 DIAGNOSIS — Z0289 Encounter for other administrative examinations: Secondary | ICD-10-CM

## 2020-08-25 ENCOUNTER — Other Ambulatory Visit: Payer: Self-pay

## 2020-08-25 ENCOUNTER — Ambulatory Visit (INDEPENDENT_AMBULATORY_CARE_PROVIDER_SITE_OTHER): Payer: 59 | Admitting: Family

## 2020-08-25 ENCOUNTER — Encounter: Payer: Self-pay | Admitting: Family

## 2020-08-25 VITALS — BP 124/86 | HR 73 | Temp 98.0°F | Ht 63.0 in | Wt 160.0 lb

## 2020-08-25 DIAGNOSIS — R002 Palpitations: Secondary | ICD-10-CM | POA: Diagnosis not present

## 2020-08-25 NOTE — Progress Notes (Signed)
Lisa Singh is a 39 y.o. female with the following history as recorded in EpicCare:  Patient Active Problem List   Diagnosis Date Noted  . Mild concussion 02/12/2018  . 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 Right wrist pain 12/13/2017  . Contusion 12/29/2016  . Encounter for routine adult medical exam with abnormal findings 04/04/2016  . Benign skin lesion of lower back 04/04/2016  . AP (abdominal pain) 05/20/2015  . Flank pain 05/20/2015  . Abnormal stools 04/12/2015  . Left flank pain, chronic 04/06/2015  . CN (constipation) 04/06/2015  . Anemia 03/11/2015  . Seasonal allergies 03/11/2015  . Mid back pain 03/11/2015    Current Outpatient Medications  Medication Sig Dispense Refill  . clobetasol ointment (TEMOVATE) 0.05 % Apply 1 application topically 2 (two) times daily. 30 g 0  . doxycycline (VIBRA-TABS) 100 MG tablet Take 1 tablet (100 mg total) by mouth 2 (two) times daily. 20 tablet 0  . fluconazole (DIFLUCAN) 150 MG tablet Take 1 tablet as directed; repeat after 72 hours 2 tablet 0  . Multiple Vitamin (MULTIVITAMIN) tablet Take 1 tablet by mouth daily.     No current facility-administered medications for this visit.    Allergies: Penicillins  Past Medical History:  Diagnosis Date  . Allergy   . Anemia    Iron deficiency  . No pertinent past medical history   . UTI (lower urinary tract infection)     Past Surgical History:  Procedure Laterality Date  . CESAREAN SECTION      Family History  Problem Relation Age of Onset  . Healthy Mother   . Hypertension Father   . Hypertension Maternal Grandmother   . Anemia Maternal Grandmother   . Cancer Paternal Grandmother   . Cancer Paternal Grandfather   . Anesthesia problems Neg Hx   . Hypotension Neg Hx   . Malignant hyperthermia Neg Hx   . Pseudochol deficiency Neg Hx   . Other Neg Hx     Social History   Tobacco Use  . Smoking status: Never Smoker  . Smokeless tobacco: Never Used  Substance  Use Topics  . Alcohol use: Yes    Comment: socially    Subjective:  Complaining of  Intermittent palpitations on and off for the past 3 months; notes that stress level has been higher recently- her mother passed away in 06-22-2023 and her job is demanding; has recently had labs done for life insurance physical and notes that they were all normal; no chest pain, shortness of breath or dizziness;   Objective:  Vitals:   08/25/20 1152  BP: 124/86  Pulse: 73  Temp: 98 F (36.7 C)  TempSrc: Oral  SpO2: 98%  Weight: 160 lb (72.6 kg)  Height: 5\' 3"  (1.6 m)    General: Well developed, well nourished, in no acute distress  Skin : Warm and dry.  Head: Normocephalic and atraumatic  Eyes: Sclera and conjunctiva clear; pupils round and reactive to light; extraocular movements intact  Ears: External normal; canals clear; tympanic membranes normal  Oropharynx: Pink, supple. No suspicious lesions  Neck: Supple without thyromegaly, adenopathy  Lungs: Respirations unlabored; clear to auscultation bilaterally without wheeze, rales, rhonchi  CVS exam: normal rate and regular rhythm.  Neurologic: Alert and oriented; speech intact; face symmetrical; moves all extremities well; CNII-XII intact without focal deficit   Assessment:  1. Palpitations     Plan:  Suspect stress related; EKG shows NSR today; patient defers labs today as she had drawn recently  for life insurance physical; physical exam is reassuring; Discussed having patient see cardiology/ wear holter but she defers at this time;  She will reach out to EAP offered through her employer for now; also encouraged to stay hydrated and take time for herself as much as possible;  Time spent 30 minutes  This visit occurred during the SARS-CoV-2 public health emergency.  Safety protocols were in place, including screening questions prior to the visit, additional usage of staff PPE, and extensive cleaning of exam room while observing appropriate  contact time as indicated for disinfecting solutions.     No follow-ups on file.  Orders Placed This Encounter  Procedures  . EKG 12-Lead    Requested Prescriptions    No prescriptions requested or ordered in this encounter

## 2020-08-25 NOTE — Patient Instructions (Signed)

## 2020-12-22 LAB — HM PAP SMEAR

## 2021-01-03 ENCOUNTER — Other Ambulatory Visit: Payer: Self-pay

## 2021-01-03 ENCOUNTER — Encounter: Payer: Self-pay | Admitting: Family

## 2021-01-03 ENCOUNTER — Ambulatory Visit (INDEPENDENT_AMBULATORY_CARE_PROVIDER_SITE_OTHER): Payer: 59 | Admitting: Family

## 2021-01-03 VITALS — BP 114/60 | HR 83 | Temp 98.4°F | Ht 62.0 in | Wt 158.8 lb

## 2021-01-03 DIAGNOSIS — Z13220 Encounter for screening for lipoid disorders: Secondary | ICD-10-CM | POA: Diagnosis not present

## 2021-01-03 DIAGNOSIS — E663 Overweight: Secondary | ICD-10-CM | POA: Diagnosis not present

## 2021-01-03 DIAGNOSIS — Z0000 Encounter for general adult medical examination without abnormal findings: Secondary | ICD-10-CM | POA: Diagnosis not present

## 2021-01-03 DIAGNOSIS — R140 Abdominal distension (gaseous): Secondary | ICD-10-CM

## 2021-01-03 NOTE — Progress Notes (Signed)
Lisa Singh is a 40 y.o. female with the following history as recorded in EpicCare:  Patient Active Problem List   Diagnosis Date Noted  . Mild concussion 02/12/2018  . 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 Right wrist pain 12/13/2017  . Contusion 12/29/2016  . Encounter for routine adult medical exam with abnormal findings 04/04/2016  . Benign skin lesion of lower back 04/04/2016  . AP (abdominal pain) 05/20/2015  . Flank pain 05/20/2015  . Abnormal stools 04/12/2015  . Left flank pain, chronic 04/06/2015  . CN (constipation) 04/06/2015  . Anemia 03/11/2015  . Seasonal allergies 03/11/2015  . Mid back pain 03/11/2015    Current Outpatient Medications  Medication Sig Dispense Refill  . clobetasol ointment (TEMOVATE) 6.15 % Apply 1 application topically 2 (two) times daily. 30 g 0  . Multiple Vitamin (MULTIVITAMIN) tablet Take 1 tablet by mouth daily.     No current facility-administered medications for this visit.    Allergies: Penicillins  Past Medical History:  Diagnosis Date  . Allergy   . Anemia    Iron deficiency  . No pertinent past medical history   . UTI (lower urinary tract infection)     Past Surgical History:  Procedure Laterality Date  . CESAREAN SECTION      Family History  Problem Relation Age of Onset  . Healthy Mother   . Hypertension Father   . Hypertension Maternal Grandmother   . Anemia Maternal Grandmother   . Cancer Paternal Grandmother   . Cancer Paternal Grandfather   . Anesthesia problems Neg Hx   . Hypotension Neg Hx   . Malignant hyperthermia Neg Hx   . Pseudochol deficiency Neg Hx   . Other Neg Hx     Social History   Tobacco Use  . Smoking status: Never Smoker  . Smokeless tobacco: Never Used  Substance Use Topics  . Alcohol use: Yes    Comment: socially    Subjective:  Presents for yearly CPE; seeing GYN regularly; Up to date with dentist and eye doctor;  Planning to get set up with therapist to discuss  anxiety/ "mental health"- feels like her job is demanding; considering to apply for FMLA;  Exercises 3-5 x per week- combination of weights and cardio;  Review of Systems  Constitutional: Negative.   HENT: Negative.   Eyes: Negative.   Respiratory: Negative.   Cardiovascular: Negative.   Gastrointestinal: Positive for abdominal pain.  Genitourinary: Negative.   Musculoskeletal: Negative.   Skin: Negative.   Neurological: Negative.   Endo/Heme/Allergies: Negative.   Psychiatric/Behavioral: The patient is nervous/anxious.         Objective:  Vitals:   01/03/21 1305  BP: 114/60  Pulse: 83  Temp: 98.4 F (36.9 C)  TempSrc: Oral  SpO2: 98%  Weight: 158 lb 12.8 oz (72 kg)  Height: _0  (1.575 m)    General: Well developed, well nourished, in no acute distress  Skin : Warm and dry.  Head: Normocephalic and atraumatic  Eyes: Sclera and conjunctiva clear; pupils round and reactive to light; extraocular movements intact  Ears: External normal; canals clear; tympanic membranes normal  Oropharynx: Pink, supple. No suspicious lesions  Neck: Supple without thyromegaly, adenopathy  Lungs: Respirations unlabored; clear to auscultation bilaterally without wheeze, rales, rhonchi  CVS exam: normal rate and regular rhythm.  Abdomen: Soft; nontender; nondistended; normoactive bowel sounds; no masses or hepatosplenomegaly  Musculoskeletal: No deformities; no active joint inflammation  Extremities: No edema, cyanosis, clubbing  Vessels: Symmetric  bilaterally  Neurologic: Alert and oriented; speech intact; face symmetrical; moves all extremities well; CNII-XII intact without focal deficit  Assessment:  1. PE (physical exam), annual   2. Lipid screening   3. Overweight (BMI 25.0-29.9)   4. Abdominal bloating     Plan:  Age appropriate preventive healthcare needs addressed; encouraged regular eye doctor and dental exams; encouraged regular exercise; will update labs and refills as  needed today; follow-up to be determined; She is not fasting today and will plan to do her labs at her convenience; She is planning to start seeing therapist and will reach out to her HR if she wants to discuss starting process of FMLA; she understands there is not documentation here to support request for long term leave from work;  This visit occurred during the SARS-CoV-2 public health emergency.  Safety protocols were in place, including screening questions prior to the visit, additional usage of staff PPE, and extensive cleaning of exam room while observing appropriate contact time as indicated for disinfecting solutions.     No follow-ups on file.  Orders Placed This Encounter  Procedures  . CBC with Differential/Platelet    Standing Status:   Future    Standing Expiration Date:   01/03/2022  . Comp Met (CMET)    Standing Status:   Future    Standing Expiration Date:   01/03/2022  . Lipid panel    Standing Status:   Future    Standing Expiration Date:   01/03/2022  . TSH    Standing Status:   Future    Standing Expiration Date:   01/03/2022  . Amb Ref to Medical Weight Management    Referral Priority:   Routine    Referral Type:   Consultation    Number of Visits Requested:   1  . Ambulatory referral to Gastroenterology    Referral Priority:   Routine    Referral Type:   Consultation    Referral Reason:   Specialty Services Required    Number of Visits Requested:   1    Requested Prescriptions    No prescriptions requested or ordered in this encounter

## 2021-01-12 ENCOUNTER — Other Ambulatory Visit (INDEPENDENT_AMBULATORY_CARE_PROVIDER_SITE_OTHER): Payer: 59

## 2021-01-12 DIAGNOSIS — Z0000 Encounter for general adult medical examination without abnormal findings: Secondary | ICD-10-CM | POA: Diagnosis not present

## 2021-01-12 DIAGNOSIS — Z13220 Encounter for screening for lipoid disorders: Secondary | ICD-10-CM | POA: Diagnosis not present

## 2021-01-12 LAB — COMPREHENSIVE METABOLIC PANEL
ALT: 12 U/L (ref 0–35)
AST: 15 U/L (ref 0–37)
Albumin: 4 g/dL (ref 3.5–5.2)
Alkaline Phosphatase: 49 U/L (ref 39–117)
BUN: 7 mg/dL (ref 6–23)
CO2: 26 mEq/L (ref 19–32)
Calcium: 9.3 mg/dL (ref 8.4–10.5)
Chloride: 104 mEq/L (ref 96–112)
Creatinine, Ser: 0.84 mg/dL (ref 0.40–1.20)
GFR: 87.52 mL/min (ref >60.00)
Glucose, Bld: 85 mg/dL (ref 70–99)
Potassium: 3.8 mEq/L (ref 3.5–5.1)
Sodium: 137 mEq/L (ref 135–145)
Total Bilirubin: 0.6 mg/dL (ref 0.2–1.2)
Total Protein: 7.4 g/dL (ref 6.0–8.3)

## 2021-01-12 LAB — LIPID PANEL
Cholesterol: 149 mg/dL (ref 0–200)
HDL: 39 mg/dL — ABNORMAL LOW (ref >39.00)
LDL Cholesterol: 99 mg/dL (ref 0–99)
NonHDL: 109.59
Total CHOL/HDL Ratio: 4
Triglycerides: 54 mg/dL (ref 0.0–149.0)
VLDL: 10.8 mg/dL (ref 0.0–40.0)

## 2021-01-12 LAB — CBC WITH DIFFERENTIAL/PLATELET
Basophils Absolute: 0 10*3/uL (ref 0.0–0.1)
Basophils Relative: 1.3 % (ref 0.0–3.0)
Eosinophils Absolute: 0.1 10*3/uL (ref 0.0–0.7)
Eosinophils Relative: 2.6 % (ref 0.0–5.0)
HCT: 33.9 % — ABNORMAL LOW (ref 36.0–46.0)
Hemoglobin: 10.8 g/dL — ABNORMAL LOW (ref 12.0–15.0)
Lymphocytes Relative: 34.8 % (ref 12.0–46.0)
Lymphs Abs: 1.3 10*3/uL (ref 0.7–4.0)
MCHC: 31.7 g/dL (ref 30.0–36.0)
MCV: 71 fl — ABNORMAL LOW (ref 78.0–100.0)
Monocytes Absolute: 0.3 10*3/uL (ref 0.1–1.0)
Monocytes Relative: 8.4 % (ref 3.0–12.0)
Neutro Abs: 2 10*3/uL (ref 1.4–7.7)
Neutrophils Relative %: 52.9 % (ref 43.0–77.0)
Platelets: 258 10*3/uL (ref 150.0–400.0)
RBC: 4.78 Mil/uL (ref 3.87–5.11)
RDW: 14.4 % (ref 11.5–15.5)
WBC: 3.8 10*3/uL — ABNORMAL LOW (ref 4.0–10.5)

## 2021-01-12 LAB — TSH: TSH: 1.18 u[IU]/mL (ref 0.35–4.50)

## 2021-01-13 ENCOUNTER — Other Ambulatory Visit: Payer: Self-pay | Admitting: Family

## 2021-01-13 DIAGNOSIS — D649 Anemia, unspecified: Secondary | ICD-10-CM

## 2021-02-09 ENCOUNTER — Encounter: Payer: Self-pay | Admitting: Family

## 2021-02-09 ENCOUNTER — Telehealth: Payer: Self-pay | Admitting: Family

## 2021-02-09 ENCOUNTER — Telehealth: Payer: Self-pay

## 2021-02-09 NOTE — Telephone Encounter (Signed)
Patient has some paperwork that needs to be filled out, she has not been seen by another provider yet.   She will be sending over a mychart message explaining everything and I have given her the office number for Vernona Rieger

## 2021-02-09 NOTE — Telephone Encounter (Signed)
I was helping out on phones today and answered a call from the patient and she was trying to get FMLA paperwork to her PCP.  I advised patient she could sent it to my email and I would print it out for PCP.  FMLA paperwork has been printed and put in PCP's box for pick up and completion.

## 2021-02-10 ENCOUNTER — Other Ambulatory Visit: Payer: Self-pay | Admitting: Family

## 2021-02-10 NOTE — Telephone Encounter (Signed)
This message has been addressed in another encounter.

## 2021-02-10 NOTE — Telephone Encounter (Signed)
I have received the paperwork and it looks like the pt will have to come into the office and complete her part of the form.

## 2021-02-10 NOTE — Telephone Encounter (Signed)
I have called the pt and there was no answer and Unable to leave VM due to VM being full.

## 2021-02-10 NOTE — Telephone Encounter (Signed)
I have attempted to call pt to follow up on the questions regarding the FMLA forms. There was no answer and her mailbox is full so I was not able to leave a voicemail.

## 2021-02-10 NOTE — Telephone Encounter (Signed)
I have attempted to call pt to follow up on the questions below regarding the FMLA forms. There was no answer and her mailbox is full so I was not able to leave a voicemail.

## 2021-02-11 NOTE — Telephone Encounter (Signed)
I have attempted to call pt once more and there was no answer.

## 2021-02-15 NOTE — Telephone Encounter (Signed)
I have called pt back and finally got in touch with her. She stated that she didn't understanding the question and gave [place] a ball park of being out 4 days a month. She says that if she is out more than that she will have to think about leaving the company she works for. I informed her that she will need to complete her part of the paperwork. She stated that she is not willing to dive to [facility] from [place]. I have offered her the option of completing her part and re-emailing it back to [place] and she was in agreement of that.

## 2021-02-16 NOTE — Telephone Encounter (Signed)
Pt called stating she had sent over [facility] paperwork with the signature required for completion.  I was able to find it in my email and pull it to print for PCP.  I advised pt at the time of the call I was not aware of the back and forth between her and PCP regarding what was needed and that I was just the catalyst in getting the paperwork to the PCP for her since she cannot get it to her any other way and was just trying to help out.  Signed FMLA documents printed and handed off to CMA.

## 2021-02-16 NOTE — Telephone Encounter (Signed)
I have received the paperwork and completed what I could. I do not see that she is taking any medications and has not been seen since 12/2019 for a CPE. I have placed the paperwork in the to be signed red folder for provider to review and complete

## 2021-02-18 ENCOUNTER — Telehealth: Payer: Self-pay | Admitting: Family

## 2021-02-18 ENCOUNTER — Encounter: Payer: Self-pay | Admitting: Family

## 2021-02-18 NOTE — Telephone Encounter (Signed)
The pt called back earlier this morning to get an update on the FMLA. I have informed her that we have just received her signed part yesterday. Vernona Rieger has just completed the FMLA.   I have called the pt

## 2021-02-18 NOTE — Telephone Encounter (Signed)
Update has been sent via My-chart by provider.

## 2021-02-18 NOTE — Telephone Encounter (Signed)
The pt has called back to get an updated. I have informed her that we have just received her completed part.    Lisa Singh has completed the [facility] and it has been faxed and a copy has been placed in scan. I have attempted to call her and the mailbox is full.

## 2021-02-18 NOTE — Telephone Encounter (Signed)
Pt called to follow up on FMLA paper work Please advice

## 2021-03-04 ENCOUNTER — Encounter: Payer: 59 | Admitting: Internal Medicine

## 2021-03-07 NOTE — Telephone Encounter (Signed)
The patient states the paperwork faxed to [employer] is missing lots of information. The patient states they need the form by today.

## 2021-03-07 NOTE — Telephone Encounter (Signed)
I have sent the pt a reply via my-chart since she also sent a message there.

## 2021-03-15 NOTE — Telephone Encounter (Signed)
I have received more paperwork from [facility], I have called them and spoke to a Maisie Fus who has informed me that the leave looks like it has been approved. He stated that he will send an internal message to a leave specialist to double check. I stated understanding and there is nothing else for [place] to do at this time.

## 2021-05-03 ENCOUNTER — Telehealth: Payer: Self-pay | Admitting: Family

## 2021-05-03 ENCOUNTER — Other Ambulatory Visit: Payer: Self-pay | Admitting: Family

## 2021-05-03 MED ORDER — CLOBETASOL PROPIONATE 0.05 % EX OINT: 1.0000 "application " | TOPICAL_OINTMENT | Freq: Two times a day (BID) | CUTANEOUS | 0 refills | Status: DC

## 2021-05-03 NOTE — Telephone Encounter (Signed)
Pt has called and wanted refill of cream until she is seen by her new PCP.

## 2021-05-12 ENCOUNTER — Ambulatory Visit: Payer: 59 | Admitting: Internal Medicine

## 2021-05-12 ENCOUNTER — Other Ambulatory Visit: Payer: Self-pay

## 2021-05-12 ENCOUNTER — Encounter: Payer: Self-pay | Admitting: Internal Medicine

## 2021-05-12 VITALS — BP 114/66 | HR 82 | Temp 98.3°F | Resp 16 | Ht 62.0 in | Wt 158.0 lb

## 2021-05-12 DIAGNOSIS — D500 Iron deficiency anemia secondary to blood loss (chronic): Secondary | ICD-10-CM

## 2021-05-12 DIAGNOSIS — Z114 Encounter for screening for human immunodeficiency virus [HIV]: Secondary | ICD-10-CM | POA: Diagnosis not present

## 2021-05-12 DIAGNOSIS — Z113 Encounter for screening for infections with a predominantly sexual mode of transmission: Secondary | ICD-10-CM | POA: Diagnosis not present

## 2021-05-12 DIAGNOSIS — Z1159 Encounter for screening for other viral diseases: Secondary | ICD-10-CM | POA: Diagnosis not present

## 2021-05-12 LAB — CBC WITH DIFFERENTIAL/PLATELET
Basophils Absolute: 0 10*3/uL (ref 0.0–0.1)
Basophils Relative: 1.1 % (ref 0.0–3.0)
Eosinophils Absolute: 0.1 10*3/uL (ref 0.0–0.7)
Eosinophils Relative: 2.3 % (ref 0.0–5.0)
HCT: 33.9 % — ABNORMAL LOW (ref 36.0–46.0)
Hemoglobin: 10.6 g/dL — ABNORMAL LOW (ref 12.0–15.0)
Lymphocytes Relative: 42.9 % (ref 12.0–46.0)
Lymphs Abs: 1.5 10*3/uL (ref 0.7–4.0)
MCHC: 31.1 g/dL (ref 30.0–36.0)
MCV: 71.5 fl — ABNORMAL LOW (ref 78.0–100.0)
Monocytes Absolute: 0.2 10*3/uL (ref 0.1–1.0)
Monocytes Relative: 6.8 % (ref 3.0–12.0)
Neutro Abs: 1.7 10*3/uL (ref 1.4–7.7)
Neutrophils Relative %: 46.9 % (ref 43.0–77.0)
Platelets: 234 10*3/uL (ref 150.0–400.0)
RBC: 4.75 Mil/uL (ref 3.87–5.11)
RDW: 14.6 % (ref 11.5–15.5)
WBC: 3.6 10*3/uL — ABNORMAL LOW (ref 4.0–10.5)

## 2021-05-12 LAB — IBC + FERRITIN
Ferritin: 9.1 ng/mL — ABNORMAL LOW (ref 10.0–291.0)
Iron: 55 ug/dL (ref 42–145)
Saturation Ratios: 14.4 % — ABNORMAL LOW (ref 20.0–50.0)
TIBC: 380.8 ug/dL (ref 250.0–450.0)
Transferrin: 272 mg/dL (ref 212.0–360.0)

## 2021-05-12 NOTE — Patient Instructions (Signed)
Iron Deficiency Anemia, Adult Iron deficiency anemia is a condition in which the concentration of red blood cells or hemoglobin in the blood is below normal because of too little iron. Hemoglobin is a substance in red blood cells that carries oxygen to the body's tissues. When the concentration of red blood cells or hemoglobin is too low, not enough oxygen reaches these tissues. Iron deficiency anemia is usually long-lasting, and it develops over time. It may or may not cause symptoms. It is a common type of anemia. What are the causes? This condition may be caused by: Not enough iron in the diet. Abnormal absorption in the gut. Increased need for iron because of pregnancy or heavy menstrual periods, for females. Cancers of the gastrointestinal system, such as colon cancer. Blood loss caused by bleeding in the intestine. This may be from a gastrointestinal condition like Crohn's disease. Frequent blood draws, such as from blood donation. What increases the risk? The following factors may make you more likely to develop this condition: Being pregnant. Being a teenage girl going through a growth spurt. What are the signs or symptoms? Symptoms of this condition may include: Pale skin, lips, and nail beds. Weakness, dizziness, and getting tired easily. Headache. Shortness of breath when moving or exercising. Cold hands and feet. Fast or irregular heartbeat. Irritability or rapid breathing. These are more common in severe anemia. Mild anemia may not cause any symptoms. How is this diagnosed? This condition is diagnosed based on: Your medical history. A physical exam. Blood tests. You may have additional tests to find the underlying cause of your anemia, such as: Testing for blood in the stool (fecal occult blood test). A procedure to see inside your colon and rectum (colonoscopy). A procedure to see inside your esophagus and stomach (endoscopy). A test in which cells are removed from  bone marrow (bone marrow aspiration) or fluid is removed from the bone marrow to be examined. This is rarely needed. How is this treated? This condition is treated by correcting the cause of your iron deficiency. Treatment may involve: Adding iron-rich foods to your diet. Taking iron supplements. If you are pregnant or breastfeeding, you may need to take extra iron because your normal diet usually does not provide the amount of iron that you need. Increasing vitamin C intake. Vitamin C helps your body absorb iron. Your health care provider may recommend that you take iron supplements along with a glass of orange juice or a vitamin C supplement. Medicines to make heavy menstrual flow lighter. Surgery. You may need repeat blood tests to determine whether treatment is working. If the treatment does not seem to be working, you may need more tests. Follow these instructions at home: Medicines Take over-the-counter and prescription medicines only as told by your health care provider. This includes iron supplements and vitamins. For the best iron absorption, you should take iron supplements when your stomach is empty. If you cannot tolerate them on an empty stomach, you may need to take them with food. Do not drink milk or take antacids at the same time as your iron supplements. Milk and antacids may interfere with iron absorption. Iron supplements may turn stool (feces) a darker color and it may appear black. If you cannot tolerate taking iron supplements by mouth, talk with your health care provider about taking them through an IV or through an injection into a muscle. Eating and drinking  Talk with your health care provider before changing your diet. He or she may recommend   that you eat foods that contain a lot of iron, such as: Liver. Low-fat (lean) beef. Breads and cereals that have iron added to them (are fortified). Eggs. Dried fruit. Dark green, leafy vegetables. To help your body use the  iron from iron-rich foods, eat those foods at the same time as fresh fruits and vegetables that are high in vitamin C. Foods that are high in vitamin C include: Oranges. Peppers. Tomatoes. Mangoes. Drink enough fluid to keep your urine pale yellow. Managing constipation If you are taking an iron supplement, it may cause constipation. To prevent or treat constipation, you may need to: Take over-the-counter or prescription medicines. Eat foods that are high in fiber, such as beans, whole grains, and fresh fruits and vegetables. Limit foods that are high in fat and processed sugars, such as fried or sweet foods. General instructions Return to your normal activities as told by your health care provider. Ask your health care provider what activities are safe for you. Practice good hygiene. Anemia can make you more prone to illness and infection. Keep all follow-up visits as told by your health care provider. This is important. Contact a health care provider if you: Feel nauseous or you vomit. Feel weak. Have unexplained sweating. Develop symptoms of constipation, such as: Having fewer than three bowel movements a week. Straining to have a bowel movement. Having stools that are hard, dry, or larger than normal. Feeling full or bloated. Pain in the lower abdomen. Not feeling relief after having a bowel movement. Get help right away if you: Faint. If this happens, do not drive yourself to the hospital. Have chest pain. Have shortness of breath that: Is severe. Gets worse with physical activity. Have an irregular or rapid heartbeat. Become light-headed when getting up from a sitting or lying down position. These symptoms may represent a serious problem that is an emergency. Do not wait to see if the symptoms will go away. Get medical help right away. Call your local emergency services (911 in the [place] Do not drive yourself to the hospital. Summary Iron deficiency anemia is a condition in  which the concentration of red blood cells or hemoglobin in the blood is below normal because of too little iron. This condition is treated by correcting the cause of your iron deficiency. Take over-the-counter and prescription medicines only as told by your health care provider. This includes iron supplements and vitamins. To help your body use the iron from iron-rich foods, eat those foods at the same time as fresh fruits and vegetables that are high in vitamin C. Get help right away if you have shortness of breath that gets worse with physical activity. This information is not intended to replace advice given to you by your health care provider. Make sure you discuss any questions you have with your health care provider. Document Revised: 05/13/2019 Document Reviewed: 05/13/2019 Elsevier Patient Education  2022 [employer]  

## 2021-05-12 NOTE — Progress Notes (Signed)
Subjective:  [name], female    DOB: Oct 06, 1980  Age: 40 y.o. MRN: [id number]  CC: Anemia  This visit occurred during the SARS-CoV-2 public health emergency.  Safety protocols were in place, including screening questions prior to the visit, additional usage of staff PPE, and extensive cleaning of exam room while observing appropriate contact time as indicated for disinfecting solutions.    HPI Lisa Singh presents for f/up and to establish.  She has a history of iron deficiency anemia.  She previously took an over-the-counter iron supplement but has not taken it recently.  She says her menstrual cycles only last 4 days and the bleeding is moderate.  She denies shortness of breath, fatigue, abdominal pain, or paresthesias.  She recently had an unprotected sexual encounter and would like to be screened for HIV and syphilis.  She refuses to get a tetanus vax today.  Outpatient Medications Prior to Visit  Medication Sig Dispense Refill   clobetasol ointment (TEMOVATE) 0.05 % Apply 1 application topically 2 (two) times daily. 15 g 0   Multiple Vitamin (MULTIVITAMIN) tablet Take 1 tablet by mouth daily.     No facility-administered medications prior to visit.    ROS Review of Systems  Constitutional:  Negative for diaphoresis and fatigue.  HENT: Negative.    Eyes: Negative.   Respiratory:  Negative for cough, chest tightness, shortness of breath and wheezing.   Cardiovascular:  Negative for chest pain, palpitations and leg swelling.  Gastrointestinal:  Negative for abdominal pain, blood in stool, constipation, diarrhea, nausea and vomiting.  Endocrine: Negative.   Genitourinary:  Negative for difficulty urinating, vaginal bleeding and vaginal discharge.  Musculoskeletal: Negative.   Skin: Negative.   Neurological:  Negative for dizziness, weakness and light-headedness.  Hematological:  Negative for adenopathy. Does not bruise/bleed easily.   Psychiatric/Behavioral: Negative.     Objective:  BP 114/66 (BP Location: Left Arm, Patient Position: Sitting, Cuff Size: Large)   Pulse 82   Temp 98.3 F (36.8 C) (Oral)   Resp 16   Ht 5\' 2"  (1.575 m)   Wt 158 lb (71.7 kg)   LMP 05/11/2021 (Exact Date)   SpO2 98%   BMI 28.90 kg/m   BP Readings from Last 3 Encounters:  05/12/21 114/66  01/03/21 114/60  08/25/20 124/86    Wt Readings from Last 3 Encounters:  05/12/21 158 lb (71.7 kg)  01/03/21 158 lb 12.8 oz (72 kg)  08/25/20 160 lb (72.6 kg)    Physical Exam Vitals reviewed.  Constitutional:      Appearance: Normal appearance.  HENT:     Nose: Nose normal.     Mouth/Throat:     Mouth: Mucous membranes are moist.  Eyes:     Conjunctiva/sclera: Conjunctivae normal.  Cardiovascular:     Rate and Rhythm: Normal rate and regular rhythm.     Heart sounds: No murmur heard. Pulmonary:     Effort: Pulmonary effort is normal.     Breath sounds: No stridor. No wheezing, rhonchi or rales.  Abdominal:     General: Abdomen is flat. Bowel sounds are normal. There is no distension.     Palpations: Abdomen is soft. There is no hepatomegaly, splenomegaly or mass.     Tenderness: There is no abdominal tenderness. There is no guarding.  Musculoskeletal:        General: Normal range of motion.     Cervical back: Neck supple.     Right lower leg: No edema.  Left lower leg: No edema.  Lymphadenopathy:     Cervical: No cervical adenopathy.  Skin:    General: Skin is warm and dry.     Coloration: Skin is pale.     Findings: No rash.  Neurological:     General: No focal deficit present.     Mental Status: She is alert.  Psychiatric:        Mood and Affect: Mood normal.        Behavior: Behavior normal.    Lab Results  Component Value Date   WBC 3.6 (L) 05/12/2021   HGB 10.6 (L) 05/12/2021   HCT 33.9 (L) 05/12/2021   PLT 234.0 05/12/2021   GLUCOSE 85 01/12/2021   CHOL 149 01/12/2021   TRIG 54.0 01/12/2021   HDL  39.00 (L) 01/12/2021   LDLCALC 99 01/12/2021   ALT 12 01/12/2021   AST 15 01/12/2021   NA 137 01/12/2021   K 3.8 01/12/2021   CL 104 01/12/2021   CREATININE 0.84 01/12/2021   BUN 7 01/12/2021   CO2 26 01/12/2021   TSH 1.18 01/12/2021    MM DIAG BREAST TOMO BILATERAL  Result Date: 04/26/2020 CLINICAL DATA:  Patient complains of bilateral axillary palpable abnormalities. EXAM: DIGITAL DIAGNOSTIC BILATERAL MAMMOGRAM WITH CAD AND TOMO ULTRASOUND BILATERAL BREAST COMPARISON:  Previous exam(s). ACR Breast Density Category b: There are scattered areas of fibroglandular density. FINDINGS: No suspicious mass, malignant type microcalcifications or distortion detected in either breast. Mammographic images were processed with CAD. On physical exam, I palpate a superficial area of thickening in the right axilla. I do not palpate a mass in the left axilla. Targeted ultrasound is performed, showing skin thickening in the right axilla where the palpable abnormality is felt. There is no discrete mass or enlarged axillary lymph nodes. Sonographic evaluation of the left axilla does not show any mass or enlarged adenopathy. IMPRESSION: No evidence of malignancy in either breast. The palpable abnormality in the right axilla is thought to be in the skin and may be a developing sebaceous cyst. RECOMMENDATION: If the clinical exam remains benign/stable screening mammography can be deferred until the age of 7. I have discussed the findings and recommendations with the patient. If applicable, a reminder letter will be sent to the patient regarding the next appointment. BI-RADS CATEGORY  1: Negative. Electronically Signed   By: Baird Lyons M.D.   On: 04/26/2020 10:00   [place] AXILLA LEFT  Result Date: 04/26/2020 CLINICAL DATA:  Patient complains of bilateral axillary palpable abnormalities. EXAM: DIGITAL DIAGNOSTIC BILATERAL MAMMOGRAM WITH CAD AND TOMO ULTRASOUND BILATERAL BREAST COMPARISON:  Previous exam(s). ACR Breast Density  Category b: There are scattered areas of fibroglandular density. FINDINGS: No suspicious mass, malignant type microcalcifications or distortion detected in either breast. Mammographic images were processed with CAD. On physical exam, I palpate a superficial area of thickening in the right axilla. I do not palpate a mass in the left axilla. Targeted ultrasound is performed, showing skin thickening in the right axilla where the palpable abnormality is felt. There is no discrete mass or enlarged axillary lymph nodes. Sonographic evaluation of the left axilla does not show any mass or enlarged adenopathy. IMPRESSION: No evidence of malignancy in either breast. The palpable abnormality in the right axilla is thought to be in the skin and may be a developing sebaceous cyst. RECOMMENDATION: If the clinical exam remains benign/stable screening mammography can be deferred until the age of 35. I have discussed the findings and recommendations with the  patient. If applicable, a reminder letter will be sent to the patient regarding the next appointment. BI-RADS CATEGORY  1: Negative. Electronically Signed   By: Baird Lyons M.D.   On: 04/26/2020 10:00   [place] AXILLA RIGHT  Result Date: 04/26/2020 CLINICAL DATA:  Patient complains of bilateral axillary palpable abnormalities. EXAM: DIGITAL DIAGNOSTIC BILATERAL MAMMOGRAM WITH CAD AND TOMO ULTRASOUND BILATERAL BREAST COMPARISON:  Previous exam(s). ACR Breast Density Category b: There are scattered areas of fibroglandular density. FINDINGS: No suspicious mass, malignant type microcalcifications or distortion detected in either breast. Mammographic images were processed with CAD. On physical exam, I palpate a superficial area of thickening in the right axilla. I do not palpate a mass in the left axilla. Targeted ultrasound is performed, showing skin thickening in the right axilla where the palpable abnormality is felt. There is no discrete mass or enlarged axillary lymph nodes.  Sonographic evaluation of the left axilla does not show any mass or enlarged adenopathy. IMPRESSION: No evidence of malignancy in either breast. The palpable abnormality in the right axilla is thought to be in the skin and may be a developing sebaceous cyst. RECOMMENDATION: If the clinical exam remains benign/stable screening mammography can be deferred until the age of 8. I have discussed the findings and recommendations with the patient. If applicable, a reminder letter will be sent to the patient regarding the next appointment. BI-RADS CATEGORY  1: Negative. Electronically Signed   By: Baird Lyons M.D.   On: 04/26/2020 10:00    Assessment & Plan:   Lisa Singh was seen today for anemia.  Diagnoses and all orders for this visit:  Iron deficiency anemia due to chronic blood loss- Her iron level is normal but she is still anemic.  I have asked her to return to be screened for other vitamin deficiencies. -     IBC + Ferritin; Future -     CBC with Differential/Platelet; Future -     CBC with Differential/Platelet -     IBC + Ferritin  Need for hepatitis C screening test -     Hepatitis C antibody; Future -     Hepatitis C antibody  Encounter for screening for HIV -     HIV Antibody (routine testing w rflx); Future -     HIV Antibody (routine testing w rflx)  Routine screening for STI (sexually transmitted infection) -     HIV Antibody (routine testing w rflx); Future -     RPR; Future -     Hepatitis C antibody; Future -     Hepatitis C antibody -     RPR -     HIV Antibody (routine testing w rflx)  I am having Lisa Singh maintain her multivitamin and clobetasol ointment.  No orders of the defined types were placed in this encounter.    Follow-up: Return in about 6 months (around 11/12/2021).  Sanda Linger, MD

## 2021-05-13 LAB — RPR: RPR Ser Ql: NONREACTIVE

## 2021-05-13 LAB — HEPATITIS C ANTIBODY
Hepatitis C Ab: NONREACTIVE
SIGNAL TO CUT-OFF: 0.03 (ref <1.00)

## 2021-05-13 LAB — HIV ANTIBODY (ROUTINE TESTING W REFLEX): HIV 1&2 Ab, 4th Generation: NONREACTIVE

## 2021-07-06 ENCOUNTER — Other Ambulatory Visit: Payer: Self-pay

## 2021-07-06 ENCOUNTER — Ambulatory Visit: Payer: 59 | Admitting: Internal Medicine

## 2021-07-06 ENCOUNTER — Encounter: Payer: Self-pay | Admitting: Internal Medicine

## 2021-07-06 DIAGNOSIS — E559 Vitamin D deficiency, unspecified: Secondary | ICD-10-CM

## 2021-07-06 DIAGNOSIS — L84 Corns and callosities: Secondary | ICD-10-CM | POA: Diagnosis not present

## 2021-07-06 DIAGNOSIS — M79671 Pain in right foot: Secondary | ICD-10-CM | POA: Insufficient documentation

## 2021-07-06 MED ORDER — VITAMIN D3 50 MCG (2000 UT) PO CAPS: 2000.0000 [IU] | ORAL_CAPSULE | Freq: Every day | ORAL | 3 refills | Status: AC

## 2021-07-06 NOTE — Assessment & Plan Note (Signed)
R foot callus, painful - see procedure

## 2021-07-06 NOTE — Assessment & Plan Note (Signed)
See procedure 

## 2021-07-06 NOTE — Assessment & Plan Note (Signed)
Take Vit D 2000 iu daily F/u w/Dr Yetta Barre

## 2021-07-06 NOTE — Progress Notes (Signed)
Subjective:  [name], female    DOB: [birth year]  Age: 40 y.o. MRN: [id number]  CC: Foot Injury (Knot under (R) foot)   HPI Lisa Singh presents for R foot pain x 1 mo worse w/walking Question re vit D def  Outpatient Medications Prior to Visit  Medication Sig Dispense Refill   Multiple Vitamin (MULTIVITAMIN) tablet Take 1 tablet by mouth daily.     clobetasol ointment (TEMOVATE) 0.05 % Apply 1 application topically 2 (two) times daily. (Patient not taking: Reported on 07/06/2021) 15 g 0   No facility-administered medications prior to visit.    ROS: Review of Systems  Constitutional:  Negative for unexpected weight change.  Musculoskeletal:  Positive for gait problem. Negative for arthralgias and back pain.   Objective:  BP 110/71 (BP Location: Left Arm)   Pulse 69   Temp 98.3 F (36.8 C) (Oral)   Ht 5\' 2"  (1.575 m)   Wt 158 lb 12.8 oz (72 kg)   SpO2 97%   BMI 29.04 kg/m   BP Readings from Last 3 Encounters:  07/06/21 110/71  05/12/21 114/66  01/03/21 114/60    Wt Readings from Last 3 Encounters:  07/06/21 158 lb 12.8 oz (72 kg)  05/12/21 158 lb (71.7 kg)  01/03/21 158 lb 12.8 oz (72 kg)    Physical Exam Constitutional:      Appearance: She is normal weight.  Musculoskeletal:        General: Tenderness present.     Right lower leg: No edema.     Left lower leg: No edema.  Skin:    Findings: Lesion present.  Neurological:     Mental Status: She is oriented to person, place, and time.     Sensory: No sensory deficit.     Coordination: Coordination normal.     Gait: Gait normal.  Psychiatric:        Mood and Affect: Mood normal.   Procedure:    foot callus paring/cutting  Indication:    R foot callus, painful  Consent: verbal  Risks and benefits were explained to the patient. Skin was cleaned with alcohol. I removed a large callus carefully with a round blade. Skin remained intact. Pain is better. Tolerated well. Complications:  none. Bandaid applied   Lab Results  Component Value Date   WBC 3.6 (L) 05/12/2021   HGB 10.6 (L) 05/12/2021   HCT 33.9 (L) 05/12/2021   PLT 234.0 05/12/2021   GLUCOSE 85 01/12/2021   CHOL 149 01/12/2021   TRIG 54.0 01/12/2021   HDL 39.00 (L) 01/12/2021   LDLCALC 99 01/12/2021   ALT 12 01/12/2021   AST 15 01/12/2021   NA 137 01/12/2021   K 3.8 01/12/2021   CL 104 01/12/2021   CREATININE 0.84 01/12/2021   BUN 7 01/12/2021   CO2 26 01/12/2021   TSH 1.18 01/12/2021    MM DIAG BREAST TOMO BILATERAL  Result Date: 04/26/2020 CLINICAL DATA:  Patient complains of bilateral axillary palpable abnormalities. EXAM: DIGITAL DIAGNOSTIC BILATERAL MAMMOGRAM WITH CAD AND TOMO ULTRASOUND BILATERAL BREAST COMPARISON:  Previous exam(s). ACR Breast Density Category b: There are scattered areas of fibroglandular density. FINDINGS: No suspicious mass, malignant type microcalcifications or distortion detected in either breast. Mammographic images were processed with CAD. On physical exam, I palpate a superficial area of thickening in the right axilla. I do not palpate a mass in the left axilla. Targeted ultrasound is performed, showing skin thickening in the right axilla where the palpable  abnormality is felt. There is no discrete mass or enlarged axillary lymph nodes. Sonographic evaluation of the left axilla does not show any mass or enlarged adenopathy. IMPRESSION: No evidence of malignancy in either breast. The palpable abnormality in the right axilla is thought to be in the skin and may be a developing sebaceous cyst. RECOMMENDATION: If the clinical exam remains benign/stable screening mammography can be deferred until the age of 78. I have discussed the findings and recommendations with the patient. If applicable, a reminder letter will be sent to the patient regarding the next appointment. BI-RADS CATEGORY  1: Negative. Electronically Signed   By: Baird Lyons M.D.   On: 04/26/2020 10:00   [place] AXILLA  LEFT  Result Date: 04/26/2020 CLINICAL DATA:  Patient complains of bilateral axillary palpable abnormalities. EXAM: DIGITAL DIAGNOSTIC BILATERAL MAMMOGRAM WITH CAD AND TOMO ULTRASOUND BILATERAL BREAST COMPARISON:  Previous exam(s). ACR Breast Density Category b: There are scattered areas of fibroglandular density. FINDINGS: No suspicious mass, malignant type microcalcifications or distortion detected in either breast. Mammographic images were processed with CAD. On physical exam, I palpate a superficial area of thickening in the right axilla. I do not palpate a mass in the left axilla. Targeted ultrasound is performed, showing skin thickening in the right axilla where the palpable abnormality is felt. There is no discrete mass or enlarged axillary lymph nodes. Sonographic evaluation of the left axilla does not show any mass or enlarged adenopathy. IMPRESSION: No evidence of malignancy in either breast. The palpable abnormality in the right axilla is thought to be in the skin and may be a developing sebaceous cyst. RECOMMENDATION: If the clinical exam remains benign/stable screening mammography can be deferred until the age of 29. I have discussed the findings and recommendations with the patient. If applicable, a reminder letter will be sent to the patient regarding the next appointment. BI-RADS CATEGORY  1: Negative. Electronically Signed   By: Baird Lyons M.D.   On: 04/26/2020 10:00   [place] AXILLA RIGHT  Result Date: 04/26/2020 CLINICAL DATA:  Patient complains of bilateral axillary palpable abnormalities. EXAM: DIGITAL DIAGNOSTIC BILATERAL MAMMOGRAM WITH CAD AND TOMO ULTRASOUND BILATERAL BREAST COMPARISON:  Previous exam(s). ACR Breast Density Category b: There are scattered areas of fibroglandular density. FINDINGS: No suspicious mass, malignant type microcalcifications or distortion detected in either breast. Mammographic images were processed with CAD. On physical exam, I palpate a superficial area of  thickening in the right axilla. I do not palpate a mass in the left axilla. Targeted ultrasound is performed, showing skin thickening in the right axilla where the palpable abnormality is felt. There is no discrete mass or enlarged axillary lymph nodes. Sonographic evaluation of the left axilla does not show any mass or enlarged adenopathy. IMPRESSION: No evidence of malignancy in either breast. The palpable abnormality in the right axilla is thought to be in the skin and may be a developing sebaceous cyst. RECOMMENDATION: If the clinical exam remains benign/stable screening mammography can be deferred until the age of 5. I have discussed the findings and recommendations with the patient. If applicable, a reminder letter will be sent to the patient regarding the next appointment. BI-RADS CATEGORY  1: Negative. Electronically Signed   By: Baird Lyons M.D.   On: 04/26/2020 10:00    Assessment & Plan:   Problem List Items Addressed This Visit     Callus    R foot callus, painful - see procedure      Foot pain, right  See procedure      Vitamin D deficiency    Take Vit D 2000 iu daily F/u w/Dr Yetta Barre         Meds ordered this encounter  Medications   Cholecalciferol (VITAMIN D3) 50 MCG (2000 UT) capsule    Sig: Take 1 capsule (2,000 Units total) by mouth daily.    Dispense:  100 capsule    Refill:  3      Follow-up: No follow-ups on file.  Sonda Primes, MD

## 2021-07-21 ENCOUNTER — Other Ambulatory Visit: Payer: Self-pay

## 2021-07-21 ENCOUNTER — Encounter: Payer: Self-pay | Admitting: Family Medicine

## 2021-07-21 ENCOUNTER — Ambulatory Visit: Payer: 59 | Admitting: Family Medicine

## 2021-07-21 VITALS — BP 124/76 | HR 88 | Temp 98.6°F | Wt 160.2 lb

## 2021-07-21 DIAGNOSIS — H6983 Other specified disorders of Eustachian tube, bilateral: Secondary | ICD-10-CM

## 2021-07-21 DIAGNOSIS — J302 Other seasonal allergic rhinitis: Secondary | ICD-10-CM | POA: Diagnosis not present

## 2021-07-21 NOTE — Progress Notes (Signed)
Acute Office Visit  Subjective:    [name], female    DOB: 1981-03-03, 40 y.o.   MRN: [id number]  Chief Complaint  Patient presents with   Ear Drainage    Left ear popping when biting, ringing, tried to clean out yesterday, but does not think it helped    HPI Patient is in today for ongoing concern.  Patient endorses in ears times months and popping sensation times a few days.  Popping sensation often occurs with discomfort/pain in the ear.  Patient cleaned her ears at home, but still had the sensation.  Also with jaw soreness.  Wearing a partial mouthguard at night for history of grinding teeth.  Antihistamine pills but often forgets to take them.  Past Medical History:  Diagnosis Date   Allergy    Anemia    Iron deficiency   No pertinent past medical history    UTI (lower urinary tract infection)     Past Surgical History:  Procedure Laterality Date   CESAREAN SECTION      Family History  Problem Relation Age of Onset   Healthy Mother    Hypertension Father    Hypertension Maternal Grandmother    Anemia Maternal Grandmother    Cancer Paternal Grandmother    Cancer Paternal Grandfather    Anesthesia problems Neg Hx    Hypotension Neg Hx    Malignant hyperthermia Neg Hx    Pseudochol deficiency Neg Hx    Other Neg Hx     Social History   Socioeconomic History   Marital status: Divorced    Spouse name: Not on file   Number of children: 2   Years of education: 14   Highest education level: Not on file  Occupational History   Occupation: English as a second language teacher  Tobacco Use   Smoking status: Never   Smokeless tobacco: Never  Substance and Sexual Activity   Alcohol use: Yes    Comment: socially   Drug use: No   Sexual activity: Yes  Other Topics Concern   Not on file  Social History Narrative   Fun: Travel, anything that is spontaneous.   Denies religious beliefs that would effect health care.    Denies abuse and feels safe at home    Social Determinants of Health   Financial Resource Strain: Not on file  Food Insecurity: Not on file  Transportation Needs: Not on file  Physical Activity: Not on file  Stress: Not on file  Social Connections: Not on file  Intimate Partner Violence: Not on file    Outpatient Medications Prior to Visit  Medication Sig Dispense Refill   Cholecalciferol (VITAMIN D3) 50 MCG (2000 UT) capsule Take 1 capsule (2,000 Units total) by mouth daily. 100 capsule 3   Multiple Vitamin (MULTIVITAMIN) tablet Take 1 tablet by mouth daily.     No facility-administered medications prior to visit.    Allergies  Allergen Reactions   Penicillins Hives    Review of Systems General: Denies fever, chills, night sweats, changes in weight, changes in appetite HEENT: Denies headaches, ear pain, changes in vision, rhinorrhea, sore throat +ear popping and ringing.  Jaw soreness CV: Denies CP, palpitations, SOB, orthopnea Pulm: Denies SOB, cough, wheezing GI: Denies abdominal pain, nausea, vomiting, diarrhea, constipation GU: Denies dysuria, hematuria, frequency, vaginal discharge Msk: Denies muscle cramps, joint pains Neuro: Denies weakness, numbness, tingling Skin: Denies rashes, bruising Psych: Denies depression, anxiety, hallucinations     Objective:    Physical Exam  There  were no vitals taken for this visit. Wt Readings from Last 3 Encounters:  07/06/21 158 lb 12.8 oz (72 kg)  05/12/21 158 lb (71.7 kg)  01/03/21 158 lb 12.8 oz (72 kg)   Gen. Pleasant, well developed, well-nourished, in NAD HEENT - [place]/AT, proptosis, PERRL, EOMI, conjunctive clear, no scleral icterus, no nasal drainage, pharynx without erythema or exudate.  Bilateral external ears and canals normal.  Bilateral TMs flat, dull gray in color without suppurative fluid.  No cervical lymphadenopathy. Neck: No JVD, no thyromegaly Lungs: no use of accessory muscles Cardiovascular: RRR, no peripheral edema Musculoskeletal: No  deformities, moves all four extremities, no cyanosis or clubbing, normal tone Neuro:  A&Ox3, CN II-XII intact, normal gait Skin:  Warm, dry, intact, no lesions   Health Maintenance Due  Topic Date Due   COVID-19 Vaccine (1) Never done    There are no preventive care reminders to display for this patient.   Lab Results  Component Value Date   TSH 1.18 01/12/2021   Lab Results  Component Value Date   WBC 3.6 (L) 05/12/2021   HGB 10.6 (L) 05/12/2021   HCT 33.9 (L) 05/12/2021   MCV 71.5 (L) 05/12/2021   PLT 234.0 05/12/2021   Lab Results  Component Value Date   NA 137 01/12/2021   K 3.8 01/12/2021   CO2 26 01/12/2021   GLUCOSE 85 01/12/2021   BUN 7 01/12/2021   CREATININE 0.84 01/12/2021   BILITOT 0.6 01/12/2021   ALKPHOS 49 01/12/2021   AST 15 01/12/2021   ALT 12 01/12/2021   PROT 7.4 01/12/2021   ALBUMIN 4.0 01/12/2021   CALCIUM 9.3 01/12/2021   ANIONGAP 7 08/26/2015   GFR 87.52 01/12/2021   Lab Results  Component Value Date   CHOL 149 01/12/2021   Lab Results  Component Value Date   HDL 39.00 (L) 01/12/2021   Lab Results  Component Value Date   LDLCALC 99 01/12/2021   Lab Results  Component Value Date   TRIG 54.0 01/12/2021   Lab Results  Component Value Date   CHOLHDL 4 01/12/2021   No results found for: HGBA1C     Assessment & Plan:   Problem List Items Addressed This Visit   None  Dysfunction of both eustachian tubes  Seasonal allergies  Discussed symptoms likely 2/2 eustachian tube dysfunction.  Advised symptoms can progress into AOM.  At this time no ABX indicated.  Advised to use Flonase nasal spray and consistent use of antihistamine such as Claritin, Allegra, Zyrtec.  Patient to obtain medications OTC.  Given precautions.  Continue use of mouth guard at night.  Follow-up as needed for continued or worsening symptoms.  Deeann Saint, MD

## 2021-07-21 NOTE — Patient Instructions (Signed)
You can use Flonase nasal spray which is available over-the-counter as well as the Claritin, Zyrtec, or Allegra allergy pills.

## 2021-09-07 ENCOUNTER — Ambulatory Visit: Payer: 59 | Admitting: Internal Medicine

## 2021-09-15 ENCOUNTER — Telehealth: Payer: Self-pay

## 2021-09-15 NOTE — Telephone Encounter (Signed)
Pt called in requesting an extension to her FMLA paperwork for Anxiety and stress in the workplace that expired November 2022.  Pt is requesting a follow up on the update.

## 2021-09-20 ENCOUNTER — Encounter: Payer: Self-pay | Admitting: Internal Medicine

## 2021-09-20 ENCOUNTER — Ambulatory Visit: Payer: 59 | Admitting: Internal Medicine

## 2021-09-20 ENCOUNTER — Other Ambulatory Visit: Payer: Self-pay

## 2021-09-20 VITALS — BP 120/74 | HR 91 | Temp 97.6°F | Ht 62.0 in | Wt 164.0 lb

## 2021-09-20 DIAGNOSIS — D500 Iron deficiency anemia secondary to blood loss (chronic): Secondary | ICD-10-CM | POA: Diagnosis not present

## 2021-09-20 DIAGNOSIS — D539 Nutritional anemia, unspecified: Secondary | ICD-10-CM

## 2021-09-20 DIAGNOSIS — D560 Alpha thalassemia: Secondary | ICD-10-CM

## 2021-09-20 DIAGNOSIS — L84 Corns and callosities: Secondary | ICD-10-CM | POA: Insufficient documentation

## 2021-09-20 DIAGNOSIS — R8290 Unspecified abnormal findings in urine: Secondary | ICD-10-CM | POA: Diagnosis not present

## 2021-09-20 NOTE — Patient Instructions (Signed)
Iron Deficiency Anemia, Adult Iron deficiency anemia is a condition in which the concentration of red blood cells or hemoglobin in the blood is below normal because of too little iron. Hemoglobin is a substance in red blood cells that carries oxygen to the body's tissues. When the concentration of red blood cells or hemoglobin is too low, not enough oxygen reaches these tissues. Iron deficiency anemia is usually long-lasting, and it develops over time. It may or may not cause symptoms. It is a common type of anemia. What are the causes? This condition may be caused by: Not enough iron in the diet. Abnormal absorption in the gut. Increased need for iron because of pregnancy or heavy menstrual periods, for females. Cancers of the gastrointestinal system, such as colon cancer. Blood loss caused by bleeding in the intestine. This may be from a gastrointestinal condition like Crohn's disease. Frequent blood draws, such as from blood donation. What increases the risk? The following factors may make you more likely to develop this condition: Being pregnant. Being a teenage girl going through a growth spurt. What are the signs or symptoms? Symptoms of this condition may include: Pale skin, lips, and nail beds. Weakness, dizziness, and getting tired easily. Headache. Shortness of breath when moving or exercising. Cold hands and feet. Fast or irregular heartbeat. Irritability or rapid breathing. These are more common in severe anemia. Mild anemia may not cause any symptoms. How is this diagnosed? This condition is diagnosed based on: Your medical history. A physical exam. Blood tests. You may have additional tests to find the underlying cause of your anemia, such as: Testing for blood in the stool (fecal occult blood test). A procedure to see inside your colon and rectum (colonoscopy). A procedure to see inside your esophagus and stomach (endoscopy). A test in which cells are removed from  bone marrow (bone marrow aspiration) or fluid is removed from the bone marrow to be examined. This is rarely needed. How is this treated? This condition is treated by correcting the cause of your iron deficiency. Treatment may involve: Adding iron-rich foods to your diet. Taking iron supplements. If you are pregnant or breastfeeding, you may need to take extra iron because your normal diet usually does not provide the amount of iron that you need. Increasing vitamin C intake. Vitamin C helps your body absorb iron. Your health care provider may recommend that you take iron supplements along with a glass of orange juice or a vitamin C supplement. Medicines to make heavy menstrual flow lighter. Surgery. You may need repeat blood tests to determine whether treatment is working. If the treatment does not seem to be working, you may need more tests. Follow these instructions at home: Medicines Take over-the-counter and prescription medicines only as told by your health care provider. This includes iron supplements and vitamins. For the best iron absorption, you should take iron supplements when your stomach is empty. If you cannot tolerate them on an empty stomach, you may need to take them with food. Do not drink milk or take antacids at the same time as your iron supplements. Milk and antacids may interfere with iron absorption. Iron supplements may turn stool (feces) a darker color and it may appear black. If you cannot tolerate taking iron supplements by mouth, talk with your health care provider about taking them through an IV or through an injection into a muscle. Eating and drinking  Talk with your health care provider before changing your diet. He or she may recommend   that you eat foods that contain a lot of iron, such as: Liver. Low-fat (lean) beef. Breads and cereals that have iron added to them (are fortified). Eggs. Dried fruit. Dark green, leafy vegetables. To help your body use the  iron from iron-rich foods, eat those foods at the same time as fresh fruits and vegetables that are high in vitamin C. Foods that are high in vitamin C include: Oranges. Peppers. Tomatoes. Mangoes. Drink enough fluid to keep your urine pale yellow. Managing constipation If you are taking an iron supplement, it may cause constipation. To prevent or treat constipation, you may need to: Take over-the-counter or prescription medicines. Eat foods that are high in fiber, such as beans, whole grains, and fresh fruits and vegetables. Limit foods that are high in fat and processed sugars, such as fried or sweet foods. General instructions Return to your normal activities as told by your health care provider. Ask your health care provider what activities are safe for you. Practice good hygiene. Anemia can make you more prone to illness and infection. Keep all follow-up visits as told by your health care provider. This is important. Contact a health care provider if you: Feel nauseous or you vomit. Feel weak. Have unexplained sweating. Develop symptoms of constipation, such as: Having fewer than three bowel movements a week. Straining to have a bowel movement. Having stools that are hard, dry, or larger than normal. Feeling full or bloated. Pain in the lower abdomen. Not feeling relief after having a bowel movement. Get help right away if you: Faint. If this happens, do not drive yourself to the hospital. Have chest pain. Have shortness of breath that: Is severe. Gets worse with physical activity. Have an irregular or rapid heartbeat. Become light-headed when getting up from a sitting or lying down position. These symptoms may represent a serious problem that is an emergency. Do not wait to see if the symptoms will go away. Get medical help right away. Call your local emergency services (911 in the [place] Do not drive yourself to the hospital. Summary Iron deficiency anemia is a condition in  which the concentration of red blood cells or hemoglobin in the blood is below normal because of too little iron. This condition is treated by correcting the cause of your iron deficiency. Take over-the-counter and prescription medicines only as told by your health care provider. This includes iron supplements and vitamins. To help your body use the iron from iron-rich foods, eat those foods at the same time as fresh fruits and vegetables that are high in vitamin C. Get help right away if you have shortness of breath that gets worse with physical activity. This information is not intended to replace advice given to you by your health care provider. Make sure you discuss any questions you have with your health care provider. Document Revised: 05/13/2019 Document Reviewed: 05/13/2019 Elsevier Patient Education  2022 [employer]  

## 2021-09-20 NOTE — Progress Notes (Signed)
Subjective:  [name], female    DOB: 1981/05/18  Age: 41 y.o. MRN: [id number]  CC: Anemia  This visit occurred during the SARS-CoV-2 public health emergency.  Safety protocols were in place, including screening questions prior to the visit, additional usage of staff PPE, and extensive cleaning of exam room while observing appropriate contact time as indicated for disinfecting solutions.    HPI Lisa Singh presents for f/up -  She complains of a painful lesion on the plantar side of her right foot. 2.   She is not taking an iron supplement. 3.   She complains of a one week hx of dark, malodorous urine.  Outpatient Medications Prior to Visit  Medication Sig Dispense Refill   Cholecalciferol (VITAMIN D3) 50 MCG (2000 UT) capsule Take 1 capsule (2,000 Units total) by mouth daily. 100 capsule 3   Multiple Vitamin (MULTIVITAMIN) tablet Take 1 tablet by mouth daily.     No facility-administered medications prior to visit.    ROS Review of Systems  Constitutional:  Negative for appetite change, diaphoresis, fatigue and unexpected weight change.  HENT: Negative.    Eyes: Negative.   Respiratory:  Negative for cough, chest tightness, shortness of breath and wheezing.   Cardiovascular:  Negative for chest pain, palpitations and leg swelling.  Gastrointestinal:  Negative for abdominal pain, blood in stool, constipation, diarrhea and vomiting.  Endocrine: Negative.   Genitourinary: Negative.  Negative for difficulty urinating.  Musculoskeletal: Negative.  Negative for arthralgias, joint swelling and myalgias.  Skin: Negative.  Negative for color change and pallor.  Allergic/Immunologic: Negative.   Neurological: Negative.  Negative for dizziness and light-headedness.  Hematological:  Negative for adenopathy. Does not bruise/bleed easily.  Psychiatric/Behavioral: Negative.     Objective:  BP 120/74 (BP Location: Right Arm, Patient Position: Sitting, Cuff Size:  Large)    Pulse 91    Temp 97.6 F (36.4 C) (Oral)    Ht 5\' 2"  (1.575 m)    Wt 164 lb (74.4 kg)    LMP 09/13/2021 (Exact Date)    SpO2 98%    BMI 30.00 kg/m   BP Readings from Last 3 Encounters:  09/20/21 120/74  07/21/21 124/76  07/06/21 110/71    Wt Readings from Last 3 Encounters:  09/20/21 164 lb (74.4 kg)  07/21/21 160 lb 3.2 oz (72.7 kg)  07/06/21 158 lb 12.8 oz (72 kg)    Physical Exam Vitals reviewed.  Constitutional:      Appearance: She is not ill-appearing.  HENT:     Nose: Nose normal.     Mouth/Throat:     Mouth: Mucous membranes are moist.  Eyes:     General: No scleral icterus.    Conjunctiva/sclera: Conjunctivae normal.  Cardiovascular:     Rate and Rhythm: Normal rate and regular rhythm.     Heart sounds: No murmur heard. Pulmonary:     Effort: Pulmonary effort is normal.     Breath sounds: No stridor. No wheezing, rhonchi or rales.  Abdominal:     General: Abdomen is flat.     Palpations: There is no mass.     Tenderness: There is no abdominal tenderness. There is no guarding.     Hernia: No hernia is present.  Musculoskeletal:        General: Normal range of motion.     Cervical back: Neck supple.     Right lower leg: No edema.     Left lower leg: No edema.  Feet:  Lymphadenopathy:     Cervical: No cervical adenopathy.  Skin:    General: Skin is warm and dry.     Coloration: Skin is not pale.  Neurological:     General: No focal deficit present.     Mental Status: She is alert.   Lab Results  Component Value Date   WBC 5.6 09/20/2021   HGB 10.7 (L) 09/21/2021   HCT 36.3 09/21/2021   PLT 321.0 09/20/2021   GLUCOSE 85 01/12/2021   CHOL 149 01/12/2021   TRIG 54.0 01/12/2021   HDL 39.00 (L) 01/12/2021   LDLCALC 99 01/12/2021   ALT 12 01/12/2021   AST 15 01/12/2021   NA 137 01/12/2021   K 3.8 01/12/2021   CL 104 01/12/2021   CREATININE 0.84 01/12/2021   BUN 7 01/12/2021   CO2 26 01/12/2021   TSH 1.18 01/12/2021    MM DIAG  BREAST TOMO BILATERAL  Result Date: 04/26/2020 CLINICAL DATA:  Patient complains of bilateral axillary palpable abnormalities. EXAM: DIGITAL DIAGNOSTIC BILATERAL MAMMOGRAM WITH CAD AND TOMO ULTRASOUND BILATERAL BREAST COMPARISON:  Previous exam(s). ACR Breast Density Category b: There are scattered areas of fibroglandular density. FINDINGS: No suspicious mass, malignant type microcalcifications or distortion detected in either breast. Mammographic images were processed with CAD. On physical exam, I palpate a superficial area of thickening in the right axilla. I do not palpate a mass in the left axilla. Targeted ultrasound is performed, showing skin thickening in the right axilla where the palpable abnormality is felt. There is no discrete mass or enlarged axillary lymph nodes. Sonographic evaluation of the left axilla does not show any mass or enlarged adenopathy. IMPRESSION: No evidence of malignancy in either breast. The palpable abnormality in the right axilla is thought to be in the skin and may be a developing sebaceous cyst. RECOMMENDATION: If the clinical exam remains benign/stable screening mammography can be deferred until the age of 26. I have discussed the findings and recommendations with the patient. If applicable, a reminder letter will be sent to the patient regarding the next appointment. BI-RADS CATEGORY  1: Negative. Electronically Signed   By: Baird Lyons M.D.   On: 04/26/2020 10:00   [place] AXILLA LEFT  Result Date: 04/26/2020 CLINICAL DATA:  Patient complains of bilateral axillary palpable abnormalities. EXAM: DIGITAL DIAGNOSTIC BILATERAL MAMMOGRAM WITH CAD AND TOMO ULTRASOUND BILATERAL BREAST COMPARISON:  Previous exam(s). ACR Breast Density Category b: There are scattered areas of fibroglandular density. FINDINGS: No suspicious mass, malignant type microcalcifications or distortion detected in either breast. Mammographic images were processed with CAD. On physical exam, I palpate a superficial  area of thickening in the right axilla. I do not palpate a mass in the left axilla. Targeted ultrasound is performed, showing skin thickening in the right axilla where the palpable abnormality is felt. There is no discrete mass or enlarged axillary lymph nodes. Sonographic evaluation of the left axilla does not show any mass or enlarged adenopathy. IMPRESSION: No evidence of malignancy in either breast. The palpable abnormality in the right axilla is thought to be in the skin and may be a developing sebaceous cyst. RECOMMENDATION: If the clinical exam remains benign/stable screening mammography can be deferred until the age of 71. I have discussed the findings and recommendations with the patient. If applicable, a reminder letter will be sent to the patient regarding the next appointment. BI-RADS CATEGORY  1: Negative. Electronically Signed   By: Baird Lyons M.D.   On: 04/26/2020 10:00  [place] AXILLA RIGHT  Result Date: 04/26/2020 CLINICAL DATA:  Patient complains of bilateral axillary palpable abnormalities. EXAM: DIGITAL DIAGNOSTIC BILATERAL MAMMOGRAM WITH CAD AND TOMO ULTRASOUND BILATERAL BREAST COMPARISON:  Previous exam(s). ACR Breast Density Category b: There are scattered areas of fibroglandular density. FINDINGS: No suspicious mass, malignant type microcalcifications or distortion detected in either breast. Mammographic images were processed with CAD. On physical exam, I palpate a superficial area of thickening in the right axilla. I do not palpate a mass in the left axilla. Targeted ultrasound is performed, showing skin thickening in the right axilla where the palpable abnormality is felt. There is no discrete mass or enlarged axillary lymph nodes. Sonographic evaluation of the left axilla does not show any mass or enlarged adenopathy. IMPRESSION: No evidence of malignancy in either breast. The palpable abnormality in the right axilla is thought to be in the skin and may be a developing sebaceous cyst.  RECOMMENDATION: If the clinical exam remains benign/stable screening mammography can be deferred until the age of 5. I have discussed the findings and recommendations with the patient. If applicable, a reminder letter will be sent to the patient regarding the next appointment. BI-RADS CATEGORY  1: Negative. Electronically Signed   By: Lillia Mountain M.D.   On: 04/26/2020 10:00    Assessment & Plan:   Lisa Singh was seen today for anemia.  Diagnoses and all orders for this visit:  Iron deficiency anemia due to chronic blood loss- Will start an iron supplement. -     IBC + Ferritin; Future -     CBC with Differential/Platelet; Future -     CBC with Differential/Platelet -     IBC + Ferritin -     Ferric Maltol (ACCRUFER) 30 MG CAPS; Take 1 capsule by mouth in the morning and at bedtime.  Bad odor of urine- Her urine culture is unremarkable. -     CULTURE, URINE COMPREHENSIVE; Future -     Urinalysis, Routine w reflex microscopic; Future -     Urinalysis, Routine w reflex microscopic -     CULTURE, URINE COMPREHENSIVE  Callus of foot -     Ambulatory referral to Podiatry  Deficiency anemia- Will treat the iron deficiency. -     Vitamin B12; Future -     Hemoglobinopathy Evaluation; Future -     Vitamin B1; Future -     Folate; Future -     Reticulocytes; Future  Alpha thalassemia ([place])- This is another possible explanation for her chronic anemia.   I am having Lisa Singh start on Diamond. I am also having her maintain her multivitamin and Vitamin D3.  Meds ordered this encounter  Medications   Ferric Maltol (ACCRUFER) 30 MG CAPS    Sig: Take 1 capsule by mouth in the morning and at bedtime.    Dispense:  180 capsule    Refill:  0     Follow-up: Return in about 3 months (around 12/19/2021).  Scarlette Calico, MD

## 2021-09-21 ENCOUNTER — Other Ambulatory Visit (INDEPENDENT_AMBULATORY_CARE_PROVIDER_SITE_OTHER): Payer: 59

## 2021-09-21 DIAGNOSIS — D539 Nutritional anemia, unspecified: Secondary | ICD-10-CM

## 2021-09-21 LAB — IBC + FERRITIN
Ferritin: 7.5 ng/mL — ABNORMAL LOW (ref 10.0–291.0)
Iron: 72 ug/dL (ref 42–145)
Saturation Ratios: 19 % — ABNORMAL LOW (ref 20.0–50.0)
TIBC: 379.4 ug/dL (ref 250.0–450.0)
Transferrin: 271 mg/dL (ref 212.0–360.0)

## 2021-09-21 LAB — URINALYSIS, ROUTINE W REFLEX MICROSCOPIC
Bilirubin Urine: NEGATIVE
Hgb urine dipstick: NEGATIVE
Ketones, ur: NEGATIVE
Leukocytes,Ua: NEGATIVE
Nitrite: NEGATIVE
RBC / HPF: NONE SEEN (ref 0–?)
Specific Gravity, Urine: >=1.03 — AB (ref 1.000–1.030)
Total Protein, Urine: NEGATIVE
Urine Glucose: NEGATIVE
Urobilinogen, UA: 0.2 (ref 0.0–1.0)
pH: 6 (ref 5.0–8.0)

## 2021-09-21 LAB — CBC WITH DIFFERENTIAL/PLATELET
Basophils Absolute: 0 10*3/uL (ref 0.0–0.1)
Basophils Relative: 0.9 % (ref 0.0–3.0)
Eosinophils Absolute: 0.2 10*3/uL (ref 0.0–0.7)
Eosinophils Relative: 2.9 % (ref 0.0–5.0)
HCT: 34 % — ABNORMAL LOW (ref 36.0–46.0)
Hemoglobin: 10.6 g/dL — ABNORMAL LOW (ref 12.0–15.0)
Lymphocytes Relative: 33.8 % (ref 12.0–46.0)
Lymphs Abs: 1.9 10*3/uL (ref 0.7–4.0)
MCHC: 31.3 g/dL (ref 30.0–36.0)
MCV: 71.3 fl — ABNORMAL LOW (ref 78.0–100.0)
Monocytes Absolute: 0.5 10*3/uL (ref 0.1–1.0)
Monocytes Relative: 8.7 % (ref 3.0–12.0)
Neutro Abs: 3 10*3/uL (ref 1.4–7.7)
Neutrophils Relative %: 53.7 % (ref 43.0–77.0)
Platelets: 321 10*3/uL (ref 150.0–400.0)
RBC: 4.76 Mil/uL (ref 3.87–5.11)
RDW: 14.9 % (ref 11.5–15.5)
WBC: 5.6 10*3/uL (ref 4.0–10.5)

## 2021-09-21 LAB — VITAMIN B12: Vitamin B-12: 543 pg/mL (ref 211–911)

## 2021-09-21 LAB — FOLATE: Folate: 12.1 ng/mL (ref >5.9)

## 2021-09-21 MED ORDER — ACCRUFER 30 MG PO CAPS: 1.0000 | ORAL_CAPSULE | Freq: Two times a day (BID) | ORAL | 0 refills | Status: DC

## 2021-09-22 LAB — CULTURE, URINE COMPREHENSIVE

## 2021-09-25 DIAGNOSIS — D560 Alpha thalassemia: Secondary | ICD-10-CM | POA: Insufficient documentation

## 2021-09-25 LAB — HEMOGLOBINOPATHY EVALUATION
Fetal Hemoglobin Testing: <1 % (ref 0.0–1.9)
HCT: 36.3 % (ref 35.0–45.0)
Hemoglobin A2 - HGBRFX: 2.4 % (ref 2.2–3.2)
Hemoglobin: 10.7 g/dL — ABNORMAL LOW (ref 11.7–15.5)
Hgb A: 97.6 % (ref >96.0)
MCH: 22.2 pg — ABNORMAL LOW (ref 27.0–33.0)
MCV: 75.2 fL — ABNORMAL LOW (ref 80.0–100.0)
RBC: 4.83 10*6/uL (ref 3.80–5.10)
RDW: 15.3 % — ABNORMAL HIGH (ref 11.0–15.0)

## 2021-09-25 LAB — RETICULOCYTES
ABS Retic: 62920 cells/uL (ref 20000–80000)
Retic Ct Pct: 1.3 %

## 2021-09-25 LAB — VITAMIN B1: Vitamin B1 (Thiamine): 11 nmol/L (ref 8–30)

## 2021-09-26 ENCOUNTER — Telehealth: Payer: Self-pay | Admitting: Internal Medicine

## 2021-09-26 ENCOUNTER — Ambulatory Visit: Payer: 59 | Admitting: Podiatry

## 2021-09-26 ENCOUNTER — Encounter: Payer: Self-pay | Admitting: Podiatry

## 2021-09-26 ENCOUNTER — Other Ambulatory Visit: Payer: Self-pay

## 2021-09-26 DIAGNOSIS — B070 Plantar wart: Secondary | ICD-10-CM | POA: Diagnosis not present

## 2021-09-26 NOTE — Progress Notes (Signed)
Subjective:   [name], female   DOB: 41 y.o.   MRN: [id number]   HPI Patient presents with a lesion on the bottom of her right foot that she states has been sore and not as sore but she is concerned about what it is and does not think she stepped on anything.  Patient does not smoke likes to be active   Review of Systems  All other systems reviewed and are negative.      Objective:  Physical Exam Vitals and nursing note reviewed.  Constitutional:      Appearance: She is well-developed.  Pulmonary:     Effort: Pulmonary effort is normal.  Musculoskeletal:        General: Normal range of motion.  Skin:    General: Skin is warm.  Neurological:     Mental Status: She is alert.    Neurovascular status found to be intact muscle strength found to be adequate range of motion within normal limits.  Patient is noted to have a lesion plantar aspect proximal to the fifth metatarsal head that upon debridement shows slight pinpoint bleeding it is slightly tender to lateral pressure and it is localized with no erythema edema or further spread surrounding the area     Assessment:  Probability that this is a verruca plantaris versus porokeratotic type lesion or callus formation secondary to bone pressure     Plan:  H&P reviewed condition Sharp sterile debridement of lesion accomplished and I applied chemical agent to create immune response with instructions on leaving the dressing on for 24 hours and if any blistering were to occur patient is to take it off pop the area and apply Neosporin with bandage.  Patient will be seen back if it were to recur or grow in size or become painful change color we can excise it and get pathology of this

## 2021-09-26 NOTE — Telephone Encounter (Signed)
Patient calling in  Patient reviewed lab results via mychart.. patient says she does not understand what provider meant when he described her urine culture as "unremarkable" & would like a cb from nurse to clarify  Please call [phone]

## 2021-09-27 NOTE — Telephone Encounter (Signed)
Pt has been informed that "unremarkable" meant no issues we found. Her urine culture was normal.

## 2021-09-30 ENCOUNTER — Other Ambulatory Visit: Payer: Self-pay

## 2021-09-30 DIAGNOSIS — D500 Iron deficiency anemia secondary to blood loss (chronic): Secondary | ICD-10-CM

## 2021-09-30 MED ORDER — ACCRUFER 30 MG PO CAPS: 1.0000 | ORAL_CAPSULE | Freq: Two times a day (BID) | ORAL | 0 refills | Status: DC

## 2021-09-30 NOTE — Telephone Encounter (Signed)
Pt has been provided the [employer] [facility] fax number as we have not received any FMLA forms via the main fax line.   I have also moved the Accrufer Rx to her local pharmacy as she has requested.

## 2021-09-30 NOTE — Telephone Encounter (Signed)
Patient calling in  Patient says no one has contacted her back yet in regards to the updates she needed for her FMLA  Also requesting rx for Ferric Maltol (ACCRUFER) 30 MG CAPS be sent to local pharmacy bc she has not received it via the [facility] mail delivery   [facility] [phone] [place], [place] - [address] Phone:  [phone]  Fax:  [phone]      Please call w/ FMLA update [phone]

## 2021-10-04 NOTE — Telephone Encounter (Signed)
Forms completed and given to PCP for review and signature.

## 2021-10-04 NOTE — Telephone Encounter (Signed)
Pt has been informed that FMLA has been completed. However, she needs to sign on the employee signature prior to it being faxed back. She expressed understanding and said she would stop by a little before 5pm today to sign.

## 2021-10-04 NOTE — Telephone Encounter (Signed)
FLMA forms received via fax today.   Pt stated that she is requesting intermittent leave for anxiety/stress in the work place. She stated that she is also still grieving the loss of her mother. She is requesting the start date be today 10/04/2021.

## 2021-10-05 ENCOUNTER — Telehealth: Payer: Self-pay | Admitting: Internal Medicine

## 2021-10-05 ENCOUNTER — Other Ambulatory Visit: Payer: Self-pay | Admitting: Internal Medicine

## 2021-10-05 DIAGNOSIS — Z0279 Encounter for issue of other medical certificate: Secondary | ICD-10-CM

## 2021-10-05 DIAGNOSIS — D500 Iron deficiency anemia secondary to blood loss (chronic): Secondary | ICD-10-CM

## 2021-10-05 NOTE — Telephone Encounter (Signed)
Pt has signed FMLA  Forms have been faxed Copy sent to pt Copy given to charge Original filed with CMA

## 2021-10-05 NOTE — Telephone Encounter (Signed)
Patient states she was not informed Ferric Maltol (ACCRUFER) 30 MG CAPS would be delivered by [facility]  Patient states she never received the medication and [facility]  Patient was very upset and stated medication at [facility] cost $1,000, patient inquired why would she be prescribed a medication that expensive  Advised patient rx was sent to [facility] to save her on cost  Inquired if patient called [facility] to inquire about the undelivered medication,  patient states she had not and she should not have to  Provided patient w/ [facility] phone number  Patient requesting an alternative affordable medication

## 2021-10-06 NOTE — Telephone Encounter (Signed)
I have spoke to the pt. She stated that she has spoken to [facility] was and informed the reason that it was not mailed to her yet was because she failed to call them to verify her demographics and address prior to shipment. She stated that the Accrufer is free through the mail order and apologized that she requested it to go to her local pharmacy. She stated that she declines the referral at this time and would like to try the medication first.

## 2021-10-11 NOTE — Telephone Encounter (Addendum)
Beth with [facility] is calling to follow up on a PA for Ferric Maltol (ACCRUFER) 30 MG CAPS .  CB [phone]

## 2021-10-11 NOTE — Telephone Encounter (Signed)
Form received  Key: [id number]

## 2021-10-11 NOTE — Telephone Encounter (Signed)
I hve requested PA forms be sent to pod fax.

## 2021-10-12 NOTE — Telephone Encounter (Signed)
approved from 10/12/2021 to 10/12/2022.

## 2021-11-22 ENCOUNTER — Ambulatory Visit: Payer: 59 | Admitting: Internal Medicine

## 2021-11-22 ENCOUNTER — Other Ambulatory Visit: Payer: Self-pay

## 2021-11-22 DIAGNOSIS — M79641 Pain in right hand: Secondary | ICD-10-CM

## 2021-11-22 MED ORDER — MELOXICAM 15 MG PO TABS: 15.0000 mg | ORAL_TABLET | Freq: Every day | ORAL | 0 refills | Status: DC

## 2021-11-22 NOTE — Patient Instructions (Signed)
We have sent in meloxicam to take 1 pill daily for 1-2 weeks to help fix this problem. ? ? ?

## 2021-11-22 NOTE — Progress Notes (Signed)
? ?  Subjective:  ? ?[name], female    DOB: [birth year], 41 y.o.   MRN: [id number] ? ?HPI ?The patient is a 41 YO female coming in for right hand pain. ? ?Review of Systems  ?Constitutional: Negative.   ?HENT: Negative.    ?Eyes: Negative.   ?Respiratory:  Negative for cough, chest tightness and shortness of breath.   ?Cardiovascular:  Negative for chest pain, palpitations and leg swelling.  ?Gastrointestinal:  Negative for abdominal distention, abdominal pain, constipation, diarrhea, nausea and vomiting.  ?Musculoskeletal:  Positive for arthralgias and myalgias.  ?Skin: Negative.   ?Neurological:  Positive for numbness.  ?Psychiatric/Behavioral: Negative.    ? ?Objective:  ?Physical Exam ?Constitutional:   ?   Appearance: She is well-developed.  ?HENT:  ?   Head: Normocephalic and atraumatic.  ?Cardiovascular:  ?   Rate and Rhythm: Normal rate and regular rhythm.  ?Pulmonary:  ?   Effort: Pulmonary effort is normal. No respiratory distress.  ?   Breath sounds: Normal breath sounds. No wheezing or rales.  ?Abdominal:  ?   General: Bowel sounds are normal. There is no distension.  ?   Palpations: Abdomen is soft.  ?   Tenderness: There is no abdominal tenderness. There is no rebound.  ?Musculoskeletal:     ?   General: Tenderness present.  ?   Cervical back: Normal range of motion.  ?Skin: ?   General: Skin is warm and dry.  ?Neurological:  ?   Mental Status: She is alert and oriented to person, place, and time.  ?   Coordination: Coordination normal.  ? ? ?Vitals:  ? 11/22/21 1549  ?BP: 108/84  ?Pulse: 76  ?Temp: 98.5 ?F (36.9 ?C)  ?TempSrc: Oral  ?SpO2: 95%  ?Weight: 164 lb 8 oz (74.6 kg)  ?Height: 5\' 2"  (1.575 m)  ? ? ?This visit occurred during the SARS-CoV-2 public health emergency.  Safety protocols were in place, including screening questions prior to the visit, additional usage of staff PPE, and extensive cleaning of exam room while observing appropriate contact time as indicated for  disinfecting solutions.  ? ?Assessment & Plan:  ? ?

## 2021-11-23 ENCOUNTER — Encounter: Payer: Self-pay | Admitting: Internal Medicine

## 2021-11-23 DIAGNOSIS — M79641 Pain in right hand: Secondary | ICD-10-CM | POA: Insufficient documentation

## 2021-11-23 NOTE — Assessment & Plan Note (Signed)
Suspect tendonitis or carpal tunnel. Rx meloxicam for 1-2 weeks to help with pain and if no improvement call or return. If numbness intermittent persists advised to get carpal tunnel brace and use this nightly for improvement in symptoms.  ?

## 2022-02-22 ENCOUNTER — Other Ambulatory Visit: Payer: Self-pay | Admitting: Internal Medicine

## 2022-02-22 DIAGNOSIS — Z1231 Encounter for screening mammogram for malignant neoplasm of breast: Secondary | ICD-10-CM

## 2022-02-28 ENCOUNTER — Ambulatory Visit
Admission: RE | Admit: 2022-02-28 | Discharge: 2022-02-28 | Disposition: A | Discharge status: Home / Self Care | Payer: 59 | Source: Ambulatory Visit | Attending: Internal Medicine | Admitting: Internal Medicine

## 2022-02-28 DIAGNOSIS — Z1231 Encounter for screening mammogram for malignant neoplasm of breast: Secondary | ICD-10-CM

## 2022-03-02 NOTE — Progress Notes (Deleted)
    Subjective:    [name], female    DOB: 1981-01-03, 41 y.o.   MRN: [id number]      HPI Lisa Singh is here for No chief complaint on file.        Medications and allergies reviewed with patient and updated if appropriate.  Current Outpatient Medications on File Prior to Visit  Medication Sig Dispense Refill   Cholecalciferol (VITAMIN D3) 50 MCG (2000 UT) capsule Take 1 capsule (2,000 Units total) by mouth daily. 100 capsule 3   Ferric Maltol (ACCRUFER) 30 MG CAPS Take 1 capsule by mouth in the morning and at bedtime. 180 capsule 0   meloxicam (MOBIC) 15 MG tablet Take 1 tablet (15 mg total) by mouth daily. 30 tablet 0   Multiple Vitamin (MULTIVITAMIN) tablet Take 1 tablet by mouth daily.     No current facility-administered medications on file prior to visit.    Review of Systems     Objective:  There were no vitals filed for this visit. BP Readings from Last 3 Encounters:  11/22/21 108/84  09/20/21 120/74  07/21/21 124/76   Wt Readings from Last 3 Encounters:  11/22/21 164 lb 8 oz (74.6 kg)  09/20/21 164 lb (74.4 kg)  07/21/21 160 lb 3.2 oz (72.7 kg)   There is no height or weight on file to calculate BMI.    Physical Exam         Assessment & Plan:    See Problem List for Assessment and Plan of chronic medical problems.

## 2022-03-03 ENCOUNTER — Ambulatory Visit: Payer: 59 | Admitting: Internal Medicine

## 2022-03-03 DIAGNOSIS — F419 Anxiety disorder, unspecified: Secondary | ICD-10-CM

## 2022-03-24 ENCOUNTER — Telehealth: Payer: Self-pay | Admitting: Internal Medicine

## 2022-03-24 NOTE — Telephone Encounter (Signed)
Pt stated an extension on FMLA until 09/17/22. Please contact patient if office visit is needed.

## 2022-03-27 NOTE — Telephone Encounter (Signed)
Left vm for pt to schedule ov.

## 2022-04-20 ENCOUNTER — Ambulatory Visit: Payer: 59 | Admitting: Internal Medicine

## 2022-04-20 ENCOUNTER — Encounter: Payer: Self-pay | Admitting: Internal Medicine

## 2022-04-20 VITALS — BP 126/76 | HR 79 | Temp 98.0°F | Resp 16 | Ht 62.0 in | Wt 163.0 lb

## 2022-04-20 DIAGNOSIS — Z0001 Encounter for general adult medical examination with abnormal findings: Secondary | ICD-10-CM

## 2022-04-20 DIAGNOSIS — D560 Alpha thalassemia: Secondary | ICD-10-CM | POA: Diagnosis not present

## 2022-04-20 DIAGNOSIS — F322 Major depressive disorder, single episode, severe without psychotic features: Secondary | ICD-10-CM | POA: Insufficient documentation

## 2022-04-20 DIAGNOSIS — D500 Iron deficiency anemia secondary to blood loss (chronic): Secondary | ICD-10-CM | POA: Diagnosis not present

## 2022-04-20 LAB — CBC WITH DIFFERENTIAL/PLATELET
Basophils Absolute: 0 10*3/uL (ref 0.0–0.1)
Basophils Relative: 1.3 % (ref 0.0–3.0)
Eosinophils Absolute: 0.1 10*3/uL (ref 0.0–0.7)
Eosinophils Relative: 4.3 % (ref 0.0–5.0)
HCT: 37.8 % (ref 36.0–46.0)
Hemoglobin: 11.8 g/dL — ABNORMAL LOW (ref 12.0–15.0)
Lymphocytes Relative: 47.5 % — ABNORMAL HIGH (ref 12.0–46.0)
Lymphs Abs: 1.7 10*3/uL (ref 0.7–4.0)
MCHC: 31.2 g/dL (ref 30.0–36.0)
MCV: 72.6 fl — ABNORMAL LOW (ref 78.0–100.0)
Monocytes Absolute: 0.3 10*3/uL (ref 0.1–1.0)
Monocytes Relative: 7.2 % (ref 3.0–12.0)
Neutro Abs: 1.4 10*3/uL (ref 1.4–7.7)
Neutrophils Relative %: 39.7 % — ABNORMAL LOW (ref 43.0–77.0)
Platelets: 242 10*3/uL (ref 150.0–400.0)
RBC: 5.21 Mil/uL — ABNORMAL HIGH (ref 3.87–5.11)
RDW: 14.2 % (ref 11.5–15.5)
WBC: 3.5 10*3/uL — ABNORMAL LOW (ref 4.0–10.5)

## 2022-04-20 LAB — LIPID PANEL
Cholesterol: 177 mg/dL (ref 0–200)
HDL: 41.6 mg/dL (ref >39.00)
LDL Cholesterol: 124 mg/dL — ABNORMAL HIGH (ref 0–99)
NonHDL: 135.5
Total CHOL/HDL Ratio: 4
Triglycerides: 59 mg/dL (ref 0.0–149.0)
VLDL: 11.8 mg/dL (ref 0.0–40.0)

## 2022-04-20 LAB — IBC + FERRITIN
Ferritin: 17.6 ng/mL (ref 10.0–291.0)
Iron: 76 ug/dL (ref 42–145)
Saturation Ratios: 17.5 % — ABNORMAL LOW (ref 20.0–50.0)
TIBC: 434 ug/dL (ref 250.0–450.0)
Transferrin: 310 mg/dL (ref 212.0–360.0)

## 2022-04-20 LAB — HEPATIC FUNCTION PANEL
ALT: 15 U/L (ref 0–35)
AST: 23 U/L (ref 0–37)
Albumin: 4.5 g/dL (ref 3.5–5.2)
Alkaline Phosphatase: 47 U/L (ref 39–117)
Bilirubin, Direct: 0.1 mg/dL (ref 0.0–0.3)
Total Bilirubin: 0.5 mg/dL (ref 0.2–1.2)
Total Protein: 8.2 g/dL (ref 6.0–8.3)

## 2022-04-20 LAB — TSH: TSH: 1.49 u[IU]/mL (ref 0.35–5.50)

## 2022-04-20 MED ORDER — VORTIOXETINE HBR 5 MG PO TABS: 5.0000 mg | ORAL_TABLET | Freq: Every day | ORAL | 0 refills | Status: DC

## 2022-04-20 NOTE — Patient Instructions (Signed)

## 2022-04-20 NOTE — Progress Notes (Signed)
Subjective:  [name], female    DOB: 28-Jul-1981  Age: 41 y.o. MRN: [id number]  CC: Annual Exam, Anemia, and Depression   HPI Lisa Singh presents for a CPX and f/up -   She complains of work-related stress that is contributing to anxiety and insomnia.  She has crying spells, insomnia, craving food, and passive thoughts of suicidality. She has good support network. She is active and denies chest pain, shortness of breath, or edema.  Outpatient Medications Prior to Visit  Medication Sig Dispense Refill   Cholecalciferol (VITAMIN D3) 50 MCG (2000 UT) capsule Take 1 capsule (2,000 Units total) by mouth daily. 100 capsule 3   Ferric Maltol (ACCRUFER) 30 MG CAPS Take 1 capsule by mouth in the morning and at bedtime. 180 capsule 0   Multiple Vitamin (MULTIVITAMIN) tablet Take 1 tablet by mouth daily.     meloxicam (MOBIC) 15 MG tablet Take 1 tablet (15 mg total) by mouth daily. 30 tablet 0   No facility-administered medications prior to visit.    ROS Review of Systems  Constitutional:  Positive for fatigue. Negative for diaphoresis.  HENT: Negative.    Eyes: Negative.   Respiratory: Negative.  Negative for cough, chest tightness, shortness of breath and wheezing.   Cardiovascular:  Negative for chest pain, palpitations and leg swelling.  Gastrointestinal:  Negative for abdominal pain, constipation, diarrhea, nausea and vomiting.  Endocrine: Negative.   Genitourinary: Negative.  Negative for difficulty urinating.  Musculoskeletal: Negative.   Neurological: Negative.  Negative for dizziness, speech difficulty, weakness and light-headedness.  Hematological:  Negative for adenopathy. Does not bruise/bleed easily.  Psychiatric/Behavioral:  Positive for decreased concentration, dysphoric mood, sleep disturbance and suicidal ideas. Negative for agitation, behavioral problems, confusion and self-injury. The patient is nervous/anxious. The patient is not hyperactive.      Objective:  BP 126/76 (BP Location: Left Arm, Patient Position: Sitting, Cuff Size: Large)   Pulse 79   Temp 98 F (36.7 C) (Oral)   Resp 16   Ht 5\' 2"  (1.575 m)   Wt 163 lb (73.9 kg)   LMP 04/18/2022 (Exact Date)   SpO2 95%   BMI 29.81 kg/m   BP Readings from Last 3 Encounters:  04/20/22 126/76  11/22/21 108/84  09/20/21 120/74    Wt Readings from Last 3 Encounters:  04/20/22 163 lb (73.9 kg)  11/22/21 164 lb 8 oz (74.6 kg)  09/20/21 164 lb (74.4 kg)    Physical Exam Vitals reviewed.  HENT:     Nose: Nose normal.     Mouth/Throat:     Mouth: Mucous membranes are moist.  Eyes:     General: No scleral icterus.    Conjunctiva/sclera: Conjunctivae normal.  Cardiovascular:     Rate and Rhythm: Normal rate and regular rhythm.     Pulses: Normal pulses.     Heart sounds: No murmur heard. Pulmonary:     Effort: Pulmonary effort is normal.     Breath sounds: No stridor. No wheezing, rhonchi or rales.  Abdominal:     General: Abdomen is flat.     Palpations: There is no mass.     Tenderness: There is no abdominal tenderness. There is no guarding.     Hernia: No hernia is present.  Musculoskeletal:        General: Normal range of motion.     Cervical back: Neck supple.     Right lower leg: No edema.     Left lower leg: No  edema.  Lymphadenopathy:     Cervical: No cervical adenopathy.  Skin:    General: Skin is warm and dry.  Neurological:     General: No focal deficit present.     Mental Status: She is alert.  Psychiatric:        Attention and Perception: She is inattentive.        Mood and Affect: Mood is anxious and depressed. Affect is flat and tearful.        Speech: She is communicative. Speech is not rapid and pressured, delayed or tangential.        Behavior: Behavior normal.        Thought Content: Thought content normal. Thought content is not paranoid or delusional. Thought content does not include homicidal or suicidal ideation. Thought content  does not include homicidal or suicidal plan.        Cognition and Memory: Cognition normal.        Judgment: Judgment normal.     Lab Results  Component Value Date   WBC 3.5 (L) 04/20/2022   HGB 11.8 (L) 04/20/2022   HCT 37.8 04/20/2022   PLT 242.0 04/20/2022   GLUCOSE 85 01/12/2021   CHOL 177 04/20/2022   TRIG 59.0 04/20/2022   HDL 41.60 04/20/2022   LDLCALC 124 (H) 04/20/2022   ALT 15 04/20/2022   AST 23 04/20/2022   NA 137 01/12/2021   K 3.8 01/12/2021   CL 104 01/12/2021   CREATININE 0.84 01/12/2021   BUN 7 01/12/2021   CO2 26 01/12/2021   TSH 1.49 04/20/2022    MM 3D SCREEN BREAST BILATERAL  Result Date: 03/01/2022 CLINICAL DATA:  Screening. EXAM: DIGITAL SCREENING BILATERAL MAMMOGRAM WITH TOMOSYNTHESIS AND CAD TECHNIQUE: Bilateral screening digital craniocaudal and mediolateral oblique mammograms were obtained. Bilateral screening digital breast tomosynthesis was performed. The images were evaluated with computer-aided detection. COMPARISON:  Previous exam(s). ACR Breast Density Category c: The breast tissue is heterogeneously dense, which may obscure small masses. FINDINGS: There are no findings suspicious for malignancy. IMPRESSION: No mammographic evidence of malignancy. A result letter of this screening mammogram will be mailed directly to the patient. RECOMMENDATION: Screening mammogram in one year. (Code:SM-B-01Y) BI-RADS CATEGORY  1: Negative. Electronically Signed   By: Fidela Salisbury M.D.   On: 03/01/2022 12:58    Assessment & Plan:   [name] was seen today for annual exam, anemia and depression.  Diagnoses and all orders for this visit:  Alpha thalassemia ([place])- H/H have improved. -     CBC with Differential/Platelet; Future -     CBC with Differential/Platelet  Iron deficiency anemia due to chronic blood loss- Her anemia has improved and her iron level is normal. -     IBC + Ferritin; Future -     CBC with Differential/Platelet; Future -     CBC  with Differential/Platelet -     IBC + Ferritin  Encounter for general adult medical examination with abnormal findings-Exam completed, labs reviewed, vaccines reviewed-she refused a Tdap, cancer screenings are up-to-date, patient education was given. -     Lipid panel; Future -     Lipid panel  Current severe episode of major depressive disorder without psychotic features without prior episode [facility])- She tells me that she will not act on the suicidal thoughts.  Her labs are negative for secondary causes.  Will start vortioxetine. -     TSH; Future -     Hepatic function panel; Future -     vortioxetine  HBr (TRINTELLIX) 5 MG TABS tablet; Take 1 tablet (5 mg total) by mouth daily. -     Hepatic function panel -     TSH   I have discontinued Mckinzee Dinkins's meloxicam. I am also having her start on vortioxetine HBr. Additionally, I am having her maintain her multivitamin, Vitamin D3, and ACCRUFeR.  Meds ordered this encounter  Medications   vortioxetine HBr (TRINTELLIX) 5 MG TABS tablet    Sig: Take 1 tablet (5 mg total) by mouth daily.    Dispense:  30 tablet    Refill:  0     Follow-up: Return in about 6 months (around 10/21/2022).  Sanda Linger, MD

## 2022-04-23 ENCOUNTER — Encounter: Payer: Self-pay | Admitting: Internal Medicine

## 2022-05-01 ENCOUNTER — Telehealth: Payer: Self-pay

## 2022-05-01 DIAGNOSIS — Z0289 Encounter for other administrative examinations: Secondary | ICD-10-CM

## 2022-05-01 NOTE — Telephone Encounter (Signed)
STD forms received from [employer].

## 2022-05-01 NOTE — Telephone Encounter (Signed)
Pt has scheduled a follow up to discuss increased anxiety and described having concerns of PTSD. She is seeking an extension on being out of work. I have extended the STD for 2 months.   Form has been completed and given to PCP to sign.

## 2022-05-01 NOTE — Telephone Encounter (Signed)
Form has been signed and faxed back to [employer].  Copy sent to pt via mail Copy given to charge Original filed with [facility].

## 2022-05-01 NOTE — Telephone Encounter (Signed)
Patient called back and said that this is concerning her depression and high anxiety.

## 2022-05-01 NOTE — Telephone Encounter (Signed)
Called pt, LVM.  Would like inquire for the reason for STD.  Does is relate to the FMLA done for therapy?

## 2022-05-04 ENCOUNTER — Encounter: Payer: Self-pay | Admitting: Internal Medicine

## 2022-05-04 ENCOUNTER — Ambulatory Visit: Payer: 59 | Admitting: Internal Medicine

## 2022-05-04 VITALS — BP 118/78 | HR 71 | Temp 98.1°F | Resp 16 | Ht 62.0 in | Wt 164.0 lb

## 2022-05-04 DIAGNOSIS — F322 Major depressive disorder, single episode, severe without psychotic features: Secondary | ICD-10-CM | POA: Diagnosis not present

## 2022-05-04 DIAGNOSIS — D500 Iron deficiency anemia secondary to blood loss (chronic): Secondary | ICD-10-CM

## 2022-05-04 NOTE — Progress Notes (Signed)
Subjective:  [name], female    DOB: 10/06/1980  Age: 41 y.o. MRN: [id number]  CC: Anemia and Depression   HPI [name] presents for f/up -  She is taking Trintellix and tolerating it well.  She says being away from work has helped to reduce her stress.  She says the thoughts of suicide are dissipating and she has no intent of hurting herself or anyone else.  Outpatient Medications Prior to Visit  Medication Sig Dispense Refill   Cholecalciferol (VITAMIN D3) 50 MCG (2000 UT) capsule Take 1 capsule (2,000 Units total) by mouth daily. 100 capsule 3   Ferric Maltol (ACCRUFER) 30 MG CAPS Take 1 capsule by mouth in the morning and at bedtime. 180 capsule 0   Multiple Vitamin (MULTIVITAMIN) tablet Take 1 tablet by mouth daily.     vortioxetine HBr (TRINTELLIX) 5 MG TABS tablet Take 1 tablet (5 mg total) by mouth daily. 30 tablet 0   No facility-administered medications prior to visit.    ROS Review of Systems  Constitutional:  Negative for diaphoresis and fatigue.  HENT: Negative.    Eyes: Negative.   Respiratory:  Negative for cough, chest tightness, shortness of breath and wheezing.   Cardiovascular:  Negative for chest pain, palpitations and leg swelling.  Gastrointestinal:  Negative for abdominal pain, constipation, diarrhea, nausea and vomiting.  Endocrine: Negative.   Genitourinary: Negative.  Negative for difficulty urinating.  Musculoskeletal: Negative.   Skin: Negative.   Allergic/Immunologic: Negative.   Neurological: Negative.  Negative for dizziness and weakness.  Hematological:  Negative for adenopathy. Does not bruise/bleed easily.  Psychiatric/Behavioral: Negative.      Objective:  BP 118/78 (BP Location: Left Arm, Patient Position: Sitting, Cuff Size: Large)   Pulse 71   Temp 98.1 F (36.7 C) (Oral)   Resp 16   Ht 5\' 2"  (1.575 m)   Wt 164 lb (74.4 kg)   LMP 04/18/2022 (Exact Date)   SpO2 98%   BMI 30.00 kg/m   BP Readings  from Last 3 Encounters:  05/04/22 118/78  04/20/22 126/76  11/22/21 108/84    Wt Readings from Last 3 Encounters:  05/04/22 164 lb (74.4 kg)  04/20/22 163 lb (73.9 kg)  11/22/21 164 lb 8 oz (74.6 kg)    Physical Exam Vitals reviewed.  HENT:     Nose: Nose normal.     Mouth/Throat:     Mouth: Mucous membranes are moist.  Eyes:     General: No scleral icterus.    Conjunctiva/sclera: Conjunctivae normal.  Cardiovascular:     Rate and Rhythm: Normal rate and regular rhythm.     Heart sounds: No murmur heard. Pulmonary:     Effort: Pulmonary effort is normal.     Breath sounds: No stridor. No wheezing, rhonchi or rales.  Abdominal:     General: Abdomen is flat.     Palpations: There is no mass.     Tenderness: There is no abdominal tenderness. There is no guarding.     Hernia: No hernia is present.  Musculoskeletal:        General: Normal range of motion.     Cervical back: Neck supple.     Right lower leg: No edema.     Left lower leg: No edema.  Lymphadenopathy:     Cervical: No cervical adenopathy.  Skin:    General: Skin is warm and dry.  Neurological:     General: No focal deficit present.  Psychiatric:  Attention and Perception: Attention normal.        Mood and Affect: Mood is depressed. Mood is not anxious. Affect is tearful.        Speech: Speech normal.        Behavior: Behavior normal.        Thought Content: Thought content normal.        Cognition and Memory: Cognition normal.     Lab Results  Component Value Date   WBC 3.5 (L) 04/20/2022   HGB 11.8 (L) 04/20/2022   HCT 37.8 04/20/2022   PLT 242.0 04/20/2022   GLUCOSE 85 01/12/2021   CHOL 177 04/20/2022   TRIG 59.0 04/20/2022   HDL 41.60 04/20/2022   LDLCALC 124 (H) 04/20/2022   ALT 15 04/20/2022   AST 23 04/20/2022   NA 137 01/12/2021   K 3.8 01/12/2021   CL 104 01/12/2021   CREATININE 0.84 01/12/2021   BUN 7 01/12/2021   CO2 26 01/12/2021   TSH 1.49 04/20/2022    MM 3D SCREEN  BREAST BILATERAL  Result Date: 03/01/2022 CLINICAL DATA:  Screening. EXAM: DIGITAL SCREENING BILATERAL MAMMOGRAM WITH TOMOSYNTHESIS AND CAD TECHNIQUE: Bilateral screening digital craniocaudal and mediolateral oblique mammograms were obtained. Bilateral screening digital breast tomosynthesis was performed. The images were evaluated with computer-aided detection. COMPARISON:  Previous exam(s). ACR Breast Density Category c: The breast tissue is heterogeneously dense, which may obscure small masses. FINDINGS: There are no findings suspicious for malignancy. IMPRESSION: No mammographic evidence of malignancy. A result letter of this screening mammogram will be mailed directly to the patient. RECOMMENDATION: Screening mammogram in one year. (Code:SM-B-01Y) BI-RADS CATEGORY  1: Negative. Electronically Signed   By: Ted Mcalpine M.D.   On: 03/01/2022 12:58    Assessment & Plan:   [name] was seen today for anemia and depression.  Diagnoses and all orders for this visit:  Iron deficiency anemia due to chronic blood loss- Her H&H have improved.  Current severe episode of major depressive disorder without psychotic features without prior episode ([facility])- After she finishes the 5 mg dose then I will increase to the 10 mg dose.  She has had some improvement.   I am having Elliet Meyerhoff maintain her multivitamin, Vitamin D3, ACCRUFeR, and vortioxetine HBr.  No orders of the defined types were placed in this encounter.    Follow-up: No follow-ups on file.  Lisa Linger, MD

## 2022-05-08 ENCOUNTER — Telehealth: Payer: Self-pay | Admitting: Internal Medicine

## 2022-05-08 NOTE — Telephone Encounter (Signed)
Spoke to pt. She stated that [employer] was still waiting for the forms since 8/17. I explained to the pt that I was out of office during that time and that her forms were faxed along with the chart notes this morning. She expressed understanding.

## 2022-05-08 NOTE — Telephone Encounter (Signed)
Patient is insisting on a call back to let her know that form was filled out correctly this time.

## 2022-05-08 NOTE — Telephone Encounter (Signed)
Form received from [employer] needing additional information and chart notes.   Information has been completed and faxed back.

## 2022-05-10 NOTE — Telephone Encounter (Signed)
Pt stated that her job needs a date on when pt can return to work instead of "TBD". I have spoken to PCP and he stated her return to work date is 06/02/22.  Form has been updated and faxed back.

## 2022-05-10 NOTE — Telephone Encounter (Signed)
Pt is requesting a call. She would like some information on some information that Dr. Yetta Barre put on the paperwork. She said she reached out to [employer] and asked and they advised her to call out office. She wants to know what was the extension date on the paperwork and she also has a few other questions about the paperwork.   CB: [phone]

## 2022-05-31 NOTE — Telephone Encounter (Signed)
PT calls today in regards to recent forms filled out by Dr.Jones. Forms stated that PT was put under a classification of massive depressive disorder, which is correct. The way things were worded within these forms has made it out to where PT can not work her current job whatsoever, but could do this job at another facility,  which has put her current employment in jeopardy. Forms should have stated that if need be PT could take disability leave due to mental health constraints. PT has been denied moving to another department within her current company and was told to seek workers comp. PT can not make a workers comp claim due to it not being a physical injury caused by the workplace. Please advise PT on how to follow up.  CB: [phone]

## 2022-06-01 ENCOUNTER — Encounter: Payer: Self-pay | Admitting: Internal Medicine

## 2022-06-01 ENCOUNTER — Ambulatory Visit (INDEPENDENT_AMBULATORY_CARE_PROVIDER_SITE_OTHER): Payer: 59 | Admitting: Internal Medicine

## 2022-06-01 VITALS — BP 118/82 | HR 80 | Temp 98.6°F | Ht 62.0 in | Wt 164.0 lb

## 2022-06-01 DIAGNOSIS — Z0289 Encounter for other administrative examinations: Secondary | ICD-10-CM

## 2022-06-01 DIAGNOSIS — F322 Major depressive disorder, single episode, severe without psychotic features: Secondary | ICD-10-CM

## 2022-06-01 NOTE — Patient Instructions (Signed)
Major Depressive Disorder, Adult Major depressive disorder (MDD) is a mental health condition. It may also be called clinical depression or unipolar depression. MDD causes symptoms of sadness, hopelessness, and loss of interest in things. These symptoms last most of the day, almost every day, for 2 weeks. MDD can also cause physical symptoms. It can interfere with relationships and with everyday activities, such as work, school, and activities that are usually pleasant. MDD may be mild, moderate, or severe. It may be single-episode MDD, which happens once, or recurrent MDD, which may occur multiple times. What are the causes? The exact cause of this condition is not known. MDD is most likely caused by a combination of things, which may include: Your personality traits. Learned or conditioned behaviors or thoughts or feelings that reinforce negativity. Any alcohol or substance misuse. Long-term (chronic) physical or mental health illness. Going through a traumatic experience or major life changes. What increases the risk? The following factors may make someone more likely to develop MDD: A family history of depression. Being a woman. Troubled family relationships. Abnormally low levels of certain brain chemicals. Traumatic or painful events in childhood, especially abuse or loss of a parent. A lot of stress from life experiences, such as poor living conditions or discrimination. Chronic physical illness or other mental health disorders. What are the signs or symptoms? The main symptoms of MDD usually include: Constant depressed or irritable mood. A loss of interest in things and activities. Other symptoms include: Sleeping or eating too much or too little. Unexplained weight gain or weight loss. Tiredness or low energy. Being agitated, restless, or weak. Feeling hopeless, worthless, or guilty. Trouble thinking clearly or making decisions. Thoughts of suicide or thoughts of harming  others. Isolating oneself or avoiding other people or activities. Trouble completing tasks, work, or any normal obligations. Severe symptoms of this condition may include: Psychotic depression.This may include false beliefs, or delusions. It may also include seeing, hearing, tasting, smelling, or feeling things that are not real (hallucinations). Chronic depression or persistent depressive disorder. This is low-level depression that lasts for at least 2 years. Melancholic depression, or feeling extremely sad and hopeless. Catatonic depression, which includes trouble speaking and trouble moving. How is this diagnosed? This condition may be diagnosed based on: Your symptoms. Your medical and mental health history. You may be asked questions about your lifestyle, including any drug and alcohol use. A physical exam. Blood tests to rule out other conditions. MDD is confirmed if you have the following symptoms most of the day, nearly every day, in a 2-week period: Either a depressed mood or loss of interest. At least four other MDD symptoms. How is this treated? This condition is usually treated by mental health professionals, such as psychologists, psychiatrists, and clinical social workers. You may need more than one type of treatment. Treatment may include: Psychotherapy, also called talk therapy or counseling. Types of psychotherapy include: Cognitive behavioral therapy (CBT). This teaches you to recognize unhealthy feelings, thoughts, and behaviors, and replace them with positive thoughts and actions. Interpersonal therapy (IPT). This helps you to improve the way you communicate with others or relate to them. Family therapy. This treatment includes members of your family. Medicines to treat anxiety and depression. These medicines help to balance the brain chemicals that affect your emotions. Lifestyle changes. You may be asked to: Limit alcohol use and avoid drug use. Get regular  exercise. Get plenty of sleep. Make healthy eating choices. Spend more time outdoors. Brain stimulation. This may   be done if symptoms are very severe and other treatments have not worked. Examples of this treatment are electroconvulsive therapy and transcranial magnetic stimulation. Follow these instructions at home: Activity Exercise regularly and spend time outdoors. Find activities that you enjoy doing, and make time to do them. Find healthy ways to manage stress, such as: Meditation or deep breathing. Spending time in nature. Journaling. Return to your normal activities as told by your health care provider. Ask your health care provider what activities are safe for you. Alcohol and drug use If you drink alcohol: Limit how much you use to: 0-1 drink a day for women who are not pregnant. 0-2 drinks a day for men. Be aware of how much alcohol is in your drink. In the [place] one drink equals one 12 oz bottle of beer (355 mL), one 5 oz glass of wine (148 mL), or one 1 oz glass of hard liquor (44 mL). Discuss your alcohol use with your health care provider. Alcohol can affect any antidepressant medicines you are taking. Discuss any drug use with your health care provider. General instructions  Take over-the-counter and prescription medicines only as told by your health care provider. Eat a healthy diet and get plenty of sleep. Consider joining a support group. Your health care provider may be able to recommend one. Keep all follow-up visits as told by your health care provider. This is important. Where to find more information National Alliance on Mental Illness: www.nami.org [place] National Institute of Mental Health: www.nimh.nih.gov Contact a health care provider if: Your symptoms get worse. You develop new symptoms. Get help right away if: You self-harm. You have serious thoughts about hurting yourself or others. You hallucinate. If you ever feel like you may hurt yourself or  others, or have thoughts about taking your own life, get help right away. Go to your nearest emergency department or: Call your local emergency services (911 in the [place] Call a suicide crisis helpline, such as the National Suicide Prevention Lifeline at 1-800-273-8255 or 988 in the [place] This is open 24 hours a day in the [place] Text the Crisis Text Line at 741741 (in the [place] Summary Major depressive disorder (MDD) is a mental health condition. MDD causes symptoms of sadness, hopelessness, and loss of interest in things. These symptoms last most of the day, almost every day, for 2 weeks. The symptoms of MDD can interfere with relationships and with everyday activities. Treatments and support are available for people who develop MDD. You may need more than one type of treatment. Get help right away if you have serious thoughts about hurting yourself or others. This information is not intended to replace advice given to you by your health care provider. Make sure you discuss any questions you have with your health care provider. Document Revised: 03/30/2021 Document Reviewed: 08/16/2019 Elsevier Patient Education  2023 [employer]  

## 2022-06-01 NOTE — Progress Notes (Unsigned)
Subjective:  [name], female    DOB: 1981-06-28  Age: 41 y.o. MRN: [id number]  CC: Follow-up   HPI Lisa Singh presents for f/up -  She continues to be passively suicidal but admits that she has no intentions or plans.  She comes in today to have her FMLA paperwork completed again.  She tells me she is ready to go back to work tomorrow.  Outpatient Medications Prior to Visit  Medication Sig Dispense Refill   Cholecalciferol (VITAMIN D3) 50 MCG (2000 UT) capsule Take 1 capsule (2,000 Units total) by mouth daily. 100 capsule 3   Ferric Maltol (ACCRUFER) 30 MG CAPS Take 1 capsule by mouth in the morning and at bedtime. 180 capsule 0   Multiple Vitamin (MULTIVITAMIN) tablet Take 1 tablet by mouth daily.     vortioxetine HBr (TRINTELLIX) 5 MG TABS tablet Take 1 tablet (5 mg total) by mouth daily. 30 tablet 0   No facility-administered medications prior to visit.    ROS Review of Systems  Constitutional:  Positive for fatigue.  Psychiatric/Behavioral:  Positive for decreased concentration, dysphoric mood and suicidal ideas. Negative for confusion, self-injury and sleep disturbance. The patient is nervous/anxious.   All other systems reviewed and are negative.   Objective:  BP 118/82 (BP Location: Left Arm, Patient Position: Sitting, Cuff Size: Large)   Pulse 80   Temp 98.6 F (37 C) (Oral)   Ht 5\' 2"  (1.575 m)   Wt 164 lb (74.4 kg)   SpO2 99%   BMI 30.00 kg/m   BP Readings from Last 3 Encounters:  06/01/22 118/82  05/04/22 118/78  04/20/22 126/76    Wt Readings from Last 3 Encounters:  06/01/22 164 lb (74.4 kg)  05/04/22 164 lb (74.4 kg)  04/20/22 163 lb (73.9 kg)    Physical Exam Vitals reviewed.  Psychiatric:        Attention and Perception: She is inattentive.        Mood and Affect: Mood is anxious and depressed. Affect is flat.        Speech: Speech is delayed. Speech is not tangential.        Behavior: Behavior normal.         Thought Content: Thought content normal.        Cognition and Memory: Cognition normal.        Judgment: Judgment normal.     Lab Results  Component Value Date   WBC 3.5 (L) 04/20/2022   HGB 11.8 (L) 04/20/2022   HCT 37.8 04/20/2022   PLT 242.0 04/20/2022   GLUCOSE 85 01/12/2021   CHOL 177 04/20/2022   TRIG 59.0 04/20/2022   HDL 41.60 04/20/2022   LDLCALC 124 (H) 04/20/2022   ALT 15 04/20/2022   AST 23 04/20/2022   NA 137 01/12/2021   K 3.8 01/12/2021   CL 104 01/12/2021   CREATININE 0.84 01/12/2021   BUN 7 01/12/2021   CO2 26 01/12/2021   TSH 1.49 04/20/2022    MM 3D SCREEN BREAST BILATERAL  Result Date: 03/01/2022 CLINICAL DATA:  Screening. EXAM: DIGITAL SCREENING BILATERAL MAMMOGRAM WITH TOMOSYNTHESIS AND CAD TECHNIQUE: Bilateral screening digital craniocaudal and mediolateral oblique mammograms were obtained. Bilateral screening digital breast tomosynthesis was performed. The images were evaluated with computer-aided detection. COMPARISON:  Previous exam(s). ACR Breast Density Category c: The breast tissue is heterogeneously dense, which may obscure small masses. FINDINGS: There are no findings suspicious for malignancy. IMPRESSION: No mammographic evidence of malignancy. A result letter  of this screening mammogram will be mailed directly to the patient. RECOMMENDATION: Screening mammogram in one year. (Code:SM-B-01Y) BI-RADS CATEGORY  1: Negative. Electronically Signed   By: Ted Mcalpine M.D.   On: 03/01/2022 12:58    Assessment & Plan:   Lisa Singh was seen today for follow-up.  Diagnoses and all orders for this visit:  Current severe episode of major depressive disorder without psychotic features without prior episode (HCC)- Improvement noted.  FMLA paperwork completed again.  Will increase the dose of Trintellix. -     vortioxetine HBr (TRINTELLIX) 10 MG TABS tablet; Take 1 tablet (10 mg total) by mouth daily.   I have discontinued Allison Bartz's  vortioxetine HBr. I am also having her start on vortioxetine HBr. Additionally, I am having her maintain her multivitamin, Vitamin D3, and ACCRUFeR.  Meds ordered this encounter  Medications   vortioxetine HBr (TRINTELLIX) 10 MG TABS tablet    Sig: Take 1 tablet (10 mg total) by mouth daily.    Dispense:  90 tablet    Refill:  0     Follow-up: Return in about 3 months (around 08/31/2022).  Sanda Linger, MD

## 2022-06-02 ENCOUNTER — Encounter: Payer: Self-pay | Admitting: Internal Medicine

## 2022-06-05 ENCOUNTER — Encounter: Payer: Self-pay | Admitting: Internal Medicine

## 2022-06-05 MED ORDER — VORTIOXETINE HBR 10 MG PO TABS: 10.0000 mg | ORAL_TABLET | Freq: Every day | ORAL | 0 refills | Status: AC

## 2022-06-06 ENCOUNTER — Encounter (HOSPITAL_BASED_OUTPATIENT_CLINIC_OR_DEPARTMENT_OTHER): Payer: Self-pay

## 2022-06-06 ENCOUNTER — Other Ambulatory Visit: Payer: Self-pay

## 2022-06-06 DIAGNOSIS — L03116 Cellulitis of left lower limb: Secondary | ICD-10-CM | POA: Insufficient documentation

## 2022-06-06 NOTE — ED Triage Notes (Signed)
POV, pt sts that she noticed possible insect bite on lower left leg this morning, sts red, warm and swollen, itched a little bit yesterday but got worse today. Pt alert and oriented x 4, ambulatory to triage.

## 2022-06-07 ENCOUNTER — Emergency Department (HOSPITAL_BASED_OUTPATIENT_CLINIC_OR_DEPARTMENT_OTHER)
Admission: EM | Admit: 2022-06-07 | Discharge: 2022-06-07 | Disposition: A | Discharge status: Home / Self Care | Payer: 59 | Attending: Emergency Medicine | Admitting: Emergency Medicine

## 2022-06-07 ENCOUNTER — Telehealth: Payer: Self-pay | Admitting: Internal Medicine

## 2022-06-07 DIAGNOSIS — L03116 Cellulitis of left lower limb: Secondary | ICD-10-CM

## 2022-06-07 MED ORDER — DOXYCYCLINE HYCLATE 100 MG PO TABS
100.0000 mg | ORAL_TABLET | Freq: Once | ORAL | Status: AC
  Administered 2022-06-07: 100 mg via ORAL
  Filled 2022-06-07: qty 1

## 2022-06-07 MED ORDER — DIPHENHYDRAMINE HCL 25 MG PO CAPS
25.0000 mg | ORAL_CAPSULE | Freq: Once | ORAL | Status: AC
  Administered 2022-06-07: 25 mg via ORAL
  Filled 2022-06-07: qty 1

## 2022-06-07 MED ORDER — DOXYCYCLINE HYCLATE 100 MG PO CAPS: 100.0000 mg | ORAL_CAPSULE | Freq: Two times a day (BID) | ORAL | 0 refills | Status: AC

## 2022-06-07 NOTE — Discharge Instructions (Signed)
You were seen today for redness of the left lower leg.  This may be related to an allergic reaction but may also be an early infection.  You will be treated with antibiotics.  Take Benadryl as needed for itching.  If you develop any fevers or other systemic symptoms, you should be reevaluated.

## 2022-06-07 NOTE — ED Notes (Signed)
Pt verbalizes understanding of discharge instructions. Opportunity for questioning and answers were provided. Pt discharged from ED to home.   ? ?

## 2022-06-07 NOTE — ED Provider Notes (Signed)
[place] EMERGENCY DEPT Provider Note   CSN: [id number] Arrival date & time: 06/06/22  2321     History  Chief Complaint  Patient presents with   Cellulitis    Calissa Swenor is a 41 y.o. female.  HPI     This is a 41 year old female who presents with concerns for possible insect bite.  She has noticed itching and redness to the left lower extremity.  She did not note an actual insect biting her.  She has noticed some warmth to the extremity.  No fevers or systemic symptoms.  She has not taken anything for her symptoms.  Home Medications Prior to Admission medications   Medication Sig Start Date End Date Taking? Authorizing Provider  doxycycline (VIBRAMYCIN) 100 MG capsule Take 1 capsule (100 mg total) by mouth 2 (two) times daily. 06/07/22  Yes Kenedi Cilia, Barbette Hair, MD  Cholecalciferol (VITAMIN D3) 50 MCG (2000 UT) capsule Take 1 capsule (2,000 Units total) by mouth daily. 07/06/21   Plotnikov, Evie Lacks, MD  Ferric Maltol (ACCRUFER) 30 MG CAPS Take 1 capsule by mouth in the morning and at bedtime. 09/30/21   Janith Lima, MD  Multiple Vitamin (MULTIVITAMIN) tablet Take 1 tablet by mouth daily.    [provider]  vortioxetine HBr (TRINTELLIX) 10 MG TABS tablet Take 1 tablet (10 mg total) by mouth daily. 06/05/22   Janith Lima, MD      Allergies    Penicillins    Review of Systems   Review of Systems  Constitutional:  Negative for fever.  Skin:  Positive for color change. Negative for wound.  All other systems reviewed and are negative.   Physical Exam Updated Vital Signs BP 124/80   Pulse 75   Temp 98.4 F (36.9 C) (Oral)   Resp 18   Ht 1.575 m (5\' 2" )   Wt 74.3 kg   SpO2 100%   BMI 29.96 kg/m  Physical Exam Vitals and nursing note reviewed.  Constitutional:      Appearance: She is well-developed. She is not ill-appearing.  HENT:     Head: Normocephalic and atraumatic.  Eyes:     Pupils: Pupils are equal, round, and reactive  to light.  Cardiovascular:     Rate and Rhythm: Normal rate and regular rhythm.  Pulmonary:     Effort: Pulmonary effort is normal. No respiratory distress.  Abdominal:     Palpations: Abdomen is soft.  Musculoskeletal:     Cervical back: Neck supple.  Skin:    General: Skin is warm and dry.     Comments: Focused examination of the left lower extremity with well-circumscribed 6 cm area of erythema along the proximal calf, slight warmth noted, no bite noted, no fluctuance or induration  Neurological:     Mental Status: She is alert and oriented to person, place, and time.  Psychiatric:        Mood and Affect: Mood normal.     ED Results / Procedures / Treatments   Labs (all labs ordered are listed, but only abnormal results are displayed) Labs Reviewed - No data to display  EKG None  Radiology No results found.  Procedures Procedures    Medications Ordered in ED Medications  diphenhydrAMINE (BENADRYL) capsule 25 mg (has no administration in time range)  doxycycline (VIBRA-TABS) tablet 100 mg (has no administration in time range)    ED Course/ Medical Decision Making/ A&P  Medical Decision Making Risk Prescription drug management.   This patient presents to the ED for concern of redness of the skin, this involves an extensive number of treatment options, and is a complaint that carries with it a high risk of complications and morbidity.  I considered the following differential and admission for this acute, potentially life threatening condition.  The differential diagnosis includes cellulitis, local allergic response, erysipelas  MDM:    This is a 41 year old female who presents with concerns for redness of the skin on the left lower extremity.  She is nontoxic and vital signs are reassuring.  No systemic symptoms.  She has a very well-circumscribed area of erythema which may represent a local allergic response although I do not appreciate an  actual bite.  She does report some itching as well as pain.  Erysipelas or cellulitis is also a consideration although the erythema is relatively flat.  Low suspicion for abscess.  Patient was given a dose of Benadryl.  Will cover with doxycycline for cellulitis.  (Labs, imaging, consults)  Labs: I Ordered, and personally interpreted labs.  The pertinent results include: None  Imaging Studies ordered: I ordered imaging studies including none I independently visualized and interpreted imaging. I agree with the radiologist interpretation  Additional history obtained from chart review.  External records from outside source obtained and reviewed including prior evaluations  Cardiac Monitoring: The patient was maintained on a cardiac monitor.  I personally viewed and interpreted the cardiac monitored which showed an underlying rhythm of: Normal sinus rhythm  Reevaluation: After the interventions noted above, I reevaluated the patient and found that they have :improved  Social Determinants of Health: Lives independently  Disposition: Discharge  Co morbidities that complicate the patient evaluation  Past Medical History:  Diagnosis Date   Allergy    Anemia    Iron deficiency   No pertinent past medical history    UTI (lower urinary tract infection)      Medicines Meds ordered this encounter  Medications   diphenhydrAMINE (BENADRYL) capsule 25 mg   doxycycline (VIBRA-TABS) tablet 100 mg   doxycycline (VIBRAMYCIN) 100 MG capsule    Sig: Take 1 capsule (100 mg total) by mouth 2 (two) times daily.    Dispense:  20 capsule    Refill:  0    I have reviewed the patients home medicines and have made adjustments as needed  Problem List / ED Course: Problem List Items Addressed This Visit   None Visit Diagnoses     Cellulitis of left lower extremity    -  Primary                   Final Clinical Impression(s) / ED Diagnoses Final diagnoses:  Cellulitis of left  lower extremity    Rx / DC Orders ED Discharge Orders          Ordered    doxycycline (VIBRAMYCIN) 100 MG capsule  2 times daily        06/07/22 0109              Alyssandra Hulsebus, Mayer Masker, MD 06/07/22 [phone]

## 2022-06-07 NOTE — Telephone Encounter (Signed)
FMLA has been faxed again to [employer].  Pt informed it has been sent again also.

## 2022-06-07 NOTE — Telephone Encounter (Signed)
Patient is calling to see if her fmla paperwork has been filled out.  Please call patient - she said that it should have been faxed already and she is very upset.

## 2022-06-09 NOTE — Telephone Encounter (Signed)
[employer] letter received today stating:  "Please have the healthcare provider clarify their best estimate of the total amount  of leave needed based on their medical knowledge, examination and medical history of the patient, and the patients recent experience. If a more specific response is not possible, please have the healthcare provider explain why.  An estimate of the continuous period of incapacity/need for care consistent with your requested leave dates requested below"  [employer] letter stated the begin date is 06/02/22 with an end date.  I have reached out the [place], case worker for [employer], and was advised that I could disregard letter as her employer was under the impression that the pt needed more time away from work. Lisa Singh stated the pt is back working and [employer] no longer needs this medial certification received today 06/09/22.

## 2022-07-24 ENCOUNTER — Telehealth: Payer: Self-pay | Admitting: Internal Medicine

## 2022-07-24 NOTE — Telephone Encounter (Signed)
Pt called to ask provider if he will recommend a medication for weight loss. Pt says nutritionist is not going to be helpful. Pt says she feels like medication will help. Pt also reports her friend losing weight successfully with [employer].  Please call pt to advise [phone]

## 2022-07-27 NOTE — Telephone Encounter (Signed)
Called pt, LVM to discuss.  

## 2022-08-07 NOTE — Telephone Encounter (Signed)
Patient called back - would still like to discuss her options - patient states that it doesn't have to be wygovey.  Please call patient back

## 2022-08-08 ENCOUNTER — Other Ambulatory Visit: Payer: Self-pay | Admitting: Internal Medicine

## 2022-08-22 ENCOUNTER — Ambulatory Visit: Payer: 59 | Admitting: Internal Medicine

## 2022-10-16 ENCOUNTER — Telehealth: Payer: Self-pay

## 2022-10-17 ENCOUNTER — Ambulatory Visit (INDEPENDENT_AMBULATORY_CARE_PROVIDER_SITE_OTHER): Payer: 59

## 2022-10-17 ENCOUNTER — Encounter: Payer: Self-pay | Admitting: Internal Medicine

## 2022-10-17 ENCOUNTER — Ambulatory Visit: Payer: 59 | Admitting: Internal Medicine

## 2022-10-17 VITALS — BP 114/70 | HR 70 | Temp 98.0°F | Resp 16 | Ht 62.0 in | Wt 167.0 lb

## 2022-10-17 DIAGNOSIS — M542 Cervicalgia: Secondary | ICD-10-CM

## 2022-10-17 DIAGNOSIS — R0789 Other chest pain: Secondary | ICD-10-CM | POA: Diagnosis not present

## 2022-10-17 DIAGNOSIS — D500 Iron deficiency anemia secondary to blood loss (chronic): Secondary | ICD-10-CM

## 2022-10-17 LAB — CBC WITH DIFFERENTIAL/PLATELET
Basophils Absolute: 0 10*3/uL (ref 0.0–0.1)
Basophils Relative: 0.8 % (ref 0.0–3.0)
Eosinophils Absolute: 0.2 10*3/uL (ref 0.0–0.7)
Eosinophils Relative: 5.7 % — ABNORMAL HIGH (ref 0.0–5.0)
HCT: 36.7 % (ref 36.0–46.0)
Hemoglobin: 11.4 g/dL — ABNORMAL LOW (ref 12.0–15.0)
Lymphocytes Relative: 39.2 % (ref 12.0–46.0)
Lymphs Abs: 1.6 10*3/uL (ref 0.7–4.0)
MCHC: 31.2 g/dL (ref 30.0–36.0)
MCV: 72.3 fl — ABNORMAL LOW (ref 78.0–100.0)
Monocytes Absolute: 0.3 10*3/uL (ref 0.1–1.0)
Monocytes Relative: 7.7 % (ref 3.0–12.0)
Neutro Abs: 1.9 10*3/uL (ref 1.4–7.7)
Neutrophils Relative %: 46.6 % (ref 43.0–77.0)
Platelets: 254 10*3/uL (ref 150.0–400.0)
RBC: 5.07 Mil/uL (ref 3.87–5.11)
RDW: 14.1 % (ref 11.5–15.5)
WBC: 4 10*3/uL (ref 4.0–10.5)

## 2022-10-17 LAB — IBC + FERRITIN
Ferritin: 11.4 ng/mL (ref 10.0–291.0)
Iron: 42 ug/dL (ref 42–145)
Saturation Ratios: 11.2 % — ABNORMAL LOW (ref 20.0–50.0)
TIBC: 376.6 ug/dL (ref 250.0–450.0)
Transferrin: 269 mg/dL (ref 212.0–360.0)

## 2022-10-17 MED ORDER — ACCRUFER 30 MG PO CAPS: 1.0000 | ORAL_CAPSULE | Freq: Two times a day (BID) | ORAL | 0 refills | Status: AC

## 2022-10-17 MED ORDER — TIZANIDINE HCL 2 MG PO TABS: 2.0000 mg | ORAL_TABLET | Freq: Three times a day (TID) | ORAL | 1 refills | Status: AC | PRN

## 2022-10-17 NOTE — Progress Notes (Signed)
Subjective:  [name], female    DOB: September 24, 1980  Age: 42 y.o. MRN: [id number]  CC: Anemia   HPI Zelena Bushong presents for f/up -  She complains of a several week history of nonradiating neck pain with no history of trauma or injury.  She is controlling the pain with Motrin, Biofreeze, and heating pads.  She also tells me that a day prior to this visit she had an episode of dull chest pain at rest.  She has had no more chest pain since then and denies cough, shortness of breath, wheezing, fever, or chills.  Outpatient Medications Prior to Visit  Medication Sig Dispense Refill   Cholecalciferol (VITAMIN D3) 50 MCG (2000 UT) capsule Take 1 capsule (2,000 Units total) by mouth daily. 100 capsule 3   doxycycline (VIBRAMYCIN) 100 MG capsule Take 1 capsule (100 mg total) by mouth 2 (two) times daily. 20 capsule 0   Multiple Vitamin (MULTIVITAMIN) tablet Take 1 tablet by mouth daily.     vortioxetine HBr (TRINTELLIX) 10 MG TABS tablet Take 1 tablet (10 mg total) by mouth daily. 90 tablet 0   Ferric Maltol (ACCRUFER) 30 MG CAPS Take 1 capsule by mouth in the morning and at bedtime. 180 capsule 0   No facility-administered medications prior to visit.    ROS Review of Systems  Constitutional: Negative.  Negative for chills, diaphoresis, fatigue and fever.  HENT: Negative.  Negative for trouble swallowing.   Eyes: Negative.   Respiratory:  Negative for cough, chest tightness, shortness of breath and wheezing.   Cardiovascular:  Positive for chest pain. Negative for palpitations and leg swelling.  Gastrointestinal:  Negative for abdominal pain, blood in stool, constipation, diarrhea, nausea and vomiting.  Endocrine: Negative.   Genitourinary: Negative.  Negative for difficulty urinating.  Musculoskeletal:  Positive for neck pain. Negative for arthralgias, joint swelling and myalgias.  Skin: Negative.   Neurological: Negative.  Negative for dizziness, weakness and  numbness.  Hematological:  Negative for adenopathy. Does not bruise/bleed easily.  Psychiatric/Behavioral: Negative.      Objective:  BP 114/70 (BP Location: Left Arm, Patient Position: Sitting, Cuff Size: Large)   Pulse 70   Temp 98 F (36.7 C) (Oral)   Resp 16   Ht 5\' 2"  (1.575 m)   Wt 167 lb (75.8 kg)   LMP 10/16/2022 (Approximate)   SpO2 97%   BMI 30.54 kg/m   BP Readings from Last 3 Encounters:  10/17/22 114/70  06/07/22 118/69  06/01/22 118/82    Wt Readings from Last 3 Encounters:  10/17/22 167 lb (75.8 kg)  06/06/22 163 lb 12.8 oz (74.3 kg)  06/01/22 164 lb (74.4 kg)    Physical Exam Vitals reviewed.  HENT:     Nose: Nose normal.     Mouth/Throat:     Mouth: Mucous membranes are moist.  Eyes:     General: No scleral icterus.    Conjunctiva/sclera: Conjunctivae normal.  Neck:     Thyroid: No thyroid mass or thyromegaly.  Cardiovascular:     Rate and Rhythm: Normal rate and regular rhythm.     Heart sounds: No murmur heard.    Comments: EKG- NSR, 65 bpm Normal EKG Pulmonary:     Effort: Pulmonary effort is normal.     Breath sounds: No stridor. No wheezing, rhonchi or rales.  Abdominal:     General: Abdomen is flat.     Palpations: There is no mass.     Tenderness: There is  no abdominal tenderness. There is no guarding.     Hernia: No hernia is present.  Musculoskeletal:     Cervical back: Normal range of motion and neck supple. No signs of trauma, rigidity or torticollis. Normal range of motion.     Right lower leg: No edema.     Left lower leg: No edema.  Lymphadenopathy:     Cervical: No cervical adenopathy.  Skin:    General: Skin is warm and dry.     Coloration: Skin is not pale.     Findings: No rash.  Neurological:     General: No focal deficit present.     Mental Status: She is alert. Mental status is at baseline.  Psychiatric:        Mood and Affect: Mood normal.        Behavior: Behavior normal.     Lab Results  Component  Value Date   WBC 4.0 10/17/2022   HGB 11.4 (L) 10/17/2022   HCT 36.7 10/17/2022   PLT 254.0 10/17/2022   GLUCOSE 85 01/12/2021   CHOL 177 04/20/2022   TRIG 59.0 04/20/2022   HDL 41.60 04/20/2022   LDLCALC 124 (H) 04/20/2022   ALT 15 04/20/2022   AST 23 04/20/2022   NA 137 01/12/2021   K 3.8 01/12/2021   CL 104 01/12/2021   CREATININE 0.84 01/12/2021   BUN 7 01/12/2021   CO2 26 01/12/2021   TSH 1.49 04/20/2022   DG Cervical Spine Complete  Result Date: 10/17/2022 CLINICAL DATA:  Right side neck pain, no known injury EXAM: CERVICAL SPINE - COMPLETE 4+ VIEW COMPARISON:  None Available. FINDINGS: There is no evidence of cervical spine fracture or prevertebral soft tissue swelling. Alignment is normal. No other significant bone abnormalities are identified. IMPRESSION: Negative cervical spine radiographs. Electronically Signed   By: Rolm Baptise M.D.   On: 10/17/2022 09:01     Assessment & Plan:   Daphna was seen today for anemia.  Diagnoses and all orders for this visit:  Neck pain on right side- Plain films are normal.  Will add a muscle relaxant to the ibuprofen. -     DG Cervical Spine Complete; Future -     tiZANidine (ZANAFLEX) 2 MG tablet; Take 1 tablet (2 mg total) by mouth every 8 (eight) hours as needed for muscle spasms.  Iron deficiency anemia due to chronic blood loss- Her H&H have improved.  Will continue the iron supplement. -     IBC + Ferritin; Future -     CBC with Differential/Platelet; Future -     CBC with Differential/Platelet -     IBC + Ferritin -     Ferric Maltol (ACCRUFER) 30 MG CAPS; Take 1 capsule (30 mg total) by mouth in the morning and at bedtime.  Other chest pain-I offered her reassurance based on her symptoms, exam, and EKG -     EKG 12-Lead   I have changed Emmaline Koren's ACCRUFeR. I am also having her start on tiZANidine. Additionally, I am having her maintain her multivitamin, Vitamin D3, vortioxetine HBr, and doxycycline.  Meds  ordered this encounter  Medications   tiZANidine (ZANAFLEX) 2 MG tablet    Sig: Take 1 tablet (2 mg total) by mouth every 8 (eight) hours as needed for muscle spasms.    Dispense:  30 tablet    Refill:  1   Ferric Maltol (ACCRUFER) 30 MG CAPS    Sig: Take 1 capsule (30 mg total) by mouth  in the morning and at bedtime.    Dispense:  180 capsule    Refill:  0     Follow-up: Return in about 6 months (around 04/17/2023).  Scarlette Calico, MD

## 2022-10-17 NOTE — Patient Instructions (Signed)
Cervical Sprain A cervical sprain is a stretch or tear in one or more of the ligaments in the neck. Ligaments are the tissues that connect bones. Cervical sprains can range from mild to severe. Severe cervical sprains can cause the spinal bones (vertebrae) in the neck to be unstable. This can result in spinal cord damage and in serious nervous system problems. The time that it takes for a cervical sprain to heal depends on the cause and extent of the injury. Most cervical sprains heal in 4-6 weeks. What are the causes? Cervical sprains may be caused by trauma, such as an injury from a motor vehicle accident, a fall, or a sudden forward and backward whipping movement of the head and neck (whiplash injury). Mild cervical sprains may be caused by wear and tear over time. What increases the risk? The following factors may make you more likely to develop this condition: Participating in activities that have a high risk of trauma to the neck. These include contact sports, auto racing, gymnastics, and diving. Taking risks when driving or riding in a motor vehicle. Osteoarthritis of the spine. Poor strength and flexibility of the neck. A previous neck injury. Poor posture. Spending long periods in certain positions that put stress on the neck, such as sitting at a computer for a long time. What are the signs or symptoms? Symptoms of this condition include: Pain, soreness, stiffness, tenderness, swelling, or a burning sensation in the front, back, or sides of the neck, shoulders, or upper back. Sudden tightening of neck muscles (spasms). Limited ability to move the neck. Headache. Dizziness. Nausea or vomiting. Weakness, numbness, or tingling in a hand or an arm. Symptoms may develop right away after injury, or they may develop over a few days. In some cases, symptoms may go away with treatment and return (recur) over time. How is this diagnosed? This condition may be diagnosed based on: Your  medical history. Your symptoms. Any recent injuries or known neck problems that you have, such as arthritis in the neck. A physical exam. Imaging tests, such as X-rays, MRI, and CT scan. How is this treated? This condition is treated by resting and icing the injured area and doing physical therapy exercises. Heat therapy may be used 2-3 days after the injury occurred if there is no swelling. Depending on the severity of your condition, treatment may also include: Keeping your neck in place (immobilized) for periods of time. This may be done using: A cervical collar. This supports your chin and the back of your head. A cervical traction device. This is a sling that holds up your head. The device removes weight and pressure from your neck, and it may help to relieve pain. Medicines that help to relieve pain and inflammation. Medicines that help to relax your muscles (muscle relaxants). Surgery. This is rare. Follow these instructions at home: Medicines  Take over-the-counter and prescription medicines only as told by your health care provider. Ask your health care provider if the medicine prescribed to you: Requires you to avoid driving or using heavy machinery. Can cause constipation. You may need to take these actions to prevent or treat constipation: Drink enough fluid to keep your urine pale yellow. Take over-the-counter or prescription medicines. Eat foods that are high in fiber, such as beans, whole grains, and fresh fruits and vegetables. Limit foods that are high in fat and processed sugars, such as fried or sweet foods. If you have a cervical collar: Wear the collar as told by your  health care provider. Do not remove it unless told. Ask before making any adjustments to your collar. If you have long hair, keep it outside of the collar. Ask your health care provider if you may remove the collar for cleaning and bathing. If so: Follow instructions about how to remove it  safely. Clean it by hand with mild soap and water and air-dry it completely. If your collar has removable pads, remove them every 1-2 days and wash them by hand with soap and water. Let them air-dry completely before putting them back in the collar. Tell your health care provider if your skin under the collar has irritation or sores. Managing pain, stiffness, and swelling     If directed, use a cervical traction device as told. If directed, put ice on the affected area. To do this: Put ice in a plastic bag. Place a towel between your skin and the bag. Leave the ice on for 20 minutes, 2-3 times a day. If directed, apply heat to the affected area before you do your physical therapy or as often as told by your health care provider. Use the heat source that your health care provider recommends, such as a moist heat pack or a heating pad. Place a towel between your skin and the heat source. Leave the heat on for 20-30 minutes. Remove the heat if your skin turns bright red. This is especially important if you are unable to feel pain, heat, or cold. You may have a greater risk of getting burned. Activity Do not drive while wearing a cervical collar. If you do not have a cervical collar, ask if it is safe to drive while your neck heals. Do not lift anything that is heavier than 10 lb (4.5 kg), or the limit that you are told, until your health care provider says that it is safe. Rest as told by your health care provider. If physical therapy was prescribed, do exercises as told by your health care provider or physical therapist. Return to your normal activities as told by your health care provider. Avoid positions and activities that make your symptoms worse. Ask your health care provider what activities are safe for you. General instructions Do not use any products that contain nicotine or tobacco, such as cigarettes, e-cigarettes, and chewing tobacco. These can delay healing. If you need help  quitting, ask your health care provider. Keep all follow-up visits as told by your health care provider or physical therapist. This is important. How is this prevented? To prevent a cervical sprain from happening again: Use and maintain good posture. Make any needed adjustments to your workstation to help you do this. Exercise regularly as told by your health care provider or physical therapist. Avoid risky activities that may cause a cervical sprain. Contact a health care provider if you have: Symptoms that get worse or do not get better after 2 weeks of treatment. Pain that gets worse or does not get better with medicine. New, unexplained symptoms. Sores or irritated skin on your neck from wearing your cervical collar. Get help right away if: You have severe pain. You develop numbness, tingling, or weakness in any part of your body. You cannot move a part of your body (you have paralysis). You have neck pain along with severe dizziness or headache. Summary A cervical sprain is a stretch or tear in one or more of the ligaments in the neck. Cervical sprains may be caused by trauma, such as an injury from a motor vehicle   accident, a fall, or a sudden forward and backward whipping movement of the head and neck (whiplash injury). Symptoms may develop right away after injury, or they may develop over a few days. This condition may be treated with rest, ice, heat, medicines, physical therapy, and surgery. This information is not intended to replace advice given to you by your health care provider. Make sure you discuss any questions you have with your health care provider. Document Revised: 12/12/2021 Document Reviewed: 05/14/2019 [employer] Patient Education  2023 [employer]  

## 2022-10-21 ENCOUNTER — Encounter: Payer: Self-pay | Admitting: Internal Medicine

## 2022-11-20 ENCOUNTER — Telehealth: Payer: Self-pay | Admitting: Internal Medicine

## 2022-11-20 NOTE — Telephone Encounter (Signed)
Pt call and want to transfer from [facility] to dr.Jordan and want a call back

## 2022-11-24 NOTE — Telephone Encounter (Signed)
Fine with me. BJ 

## 2023-05-23 ENCOUNTER — Other Ambulatory Visit: Payer: Self-pay | Admitting: Internal Medicine

## 2023-05-23 DIAGNOSIS — Z1231 Encounter for screening mammogram for malignant neoplasm of breast: Secondary | ICD-10-CM

## 2023-05-24 DIAGNOSIS — N3289 Other specified disorders of bladder: Secondary | ICD-10-CM | POA: Diagnosis not present

## 2023-05-24 DIAGNOSIS — Z113 Encounter for screening for infections with a predominantly sexual mode of transmission: Secondary | ICD-10-CM | POA: Diagnosis not present

## 2023-05-24 DIAGNOSIS — N760 Acute vaginitis: Secondary | ICD-10-CM | POA: Diagnosis not present

## 2023-06-01 DIAGNOSIS — Z1231 Encounter for screening mammogram for malignant neoplasm of breast: Secondary | ICD-10-CM

## 2023-06-04 ENCOUNTER — Encounter: Payer: Self-pay | Admitting: Internal Medicine

## 2023-06-04 ENCOUNTER — Ambulatory Visit: Payer: Self-pay | Admitting: Internal Medicine

## 2023-07-11 ENCOUNTER — Ambulatory Visit: Payer: Self-pay

## 2023-08-15 ENCOUNTER — Ambulatory Visit: Payer: Self-pay

## 2024-01-24 DIAGNOSIS — Z01419 Encounter for gynecological examination (general) (routine) without abnormal findings: Secondary | ICD-10-CM | POA: Diagnosis not present

## 2024-01-24 DIAGNOSIS — N898 Other specified noninflammatory disorders of vagina: Secondary | ICD-10-CM | POA: Diagnosis not present

## 2024-06-17 ENCOUNTER — Encounter: Payer: Self-pay | Admitting: Obstetrics and Gynecology

## 2024-06-17 DIAGNOSIS — Z1231 Encounter for screening mammogram for malignant neoplasm of breast: Secondary | ICD-10-CM

## 2024-06-24 ENCOUNTER — Other Ambulatory Visit: Payer: Self-pay | Admitting: Obstetrics and Gynecology

## 2024-06-24 DIAGNOSIS — Z1231 Encounter for screening mammogram for malignant neoplasm of breast: Secondary | ICD-10-CM

## 2024-06-27 ENCOUNTER — Ambulatory Visit

## 2024-09-02 ENCOUNTER — Ambulatory Visit
Admission: RE | Admit: 2024-09-02 | Discharge: 2024-09-02 | Disposition: A | Source: Ambulatory Visit | Attending: Obstetrics and Gynecology | Admitting: Obstetrics and Gynecology

## 2024-09-02 DIAGNOSIS — Z1231 Encounter for screening mammogram for malignant neoplasm of breast: Secondary | ICD-10-CM
